# Patient Record
Sex: Male | Born: 2000 | Race: White | Hispanic: No | Marital: Single | Smoking: Current some day smoker
Health system: Southern US, Community
[De-identification: ages and names within clinical notes are randomized; demographics above are authoritative.]

## PROBLEM LIST (undated history)

## (undated) DIAGNOSIS — J45909 Unspecified asthma, uncomplicated: Secondary | ICD-10-CM

---

## 2018-03-11 ENCOUNTER — Other Ambulatory Visit: Payer: Self-pay | Admitting: Family Medicine

## 2018-03-11 ENCOUNTER — Ambulatory Visit
Admission: RE | Admit: 2018-03-11 | Discharge: 2018-03-11 | Disposition: A | Discharge status: Home / Self Care | Payer: BLUE CROSS/BLUE SHIELD | Source: Ambulatory Visit | Attending: Family Medicine | Admitting: Family Medicine

## 2018-03-11 DIAGNOSIS — R059 Cough, unspecified: Secondary | ICD-10-CM

## 2018-03-11 DIAGNOSIS — R05 Cough: Secondary | ICD-10-CM | POA: Insufficient documentation

## 2019-07-12 ENCOUNTER — Ambulatory Visit: Payer: BLUE CROSS/BLUE SHIELD | Attending: Internal Medicine

## 2019-07-12 DIAGNOSIS — Z23 Encounter for immunization: Secondary | ICD-10-CM

## 2019-07-12 NOTE — Progress Notes (Signed)
   Covid-19 Vaccination Clinic  Name:  Londen Bok    MRN: [id number] DOB: 2000/07/19  07/12/2019  Mr. Rideout was observed post Covid-19 immunization for 15 minutes without incident. He was provided with Vaccine Information Sheet and instruction to access the V-Safe system.   Mr. Quackenbush was instructed to call 911 with any severe reactions post vaccine: Marland Kitchen Difficulty breathing  . Swelling of face and throat  . A fast heartbeat  . A bad rash all over body  . Dizziness and weakness   Immunizations Administered    Name Date Dose VIS Date Route   Pfizer COVID-19 Vaccine 07/12/2019 12:30 PM 0.3 mL 04/02/2019 Intramuscular   Manufacturer: [employer], [employer]   Lot: [id number]   NDC: [address]-1

## 2019-08-02 ENCOUNTER — Ambulatory Visit: Payer: Self-pay | Attending: Internal Medicine

## 2019-08-02 DIAGNOSIS — Z23 Encounter for immunization: Secondary | ICD-10-CM

## 2019-08-02 NOTE — Progress Notes (Signed)
   Covid-19 Vaccination Clinic  Name:  Martin Boyer    MRN: [id number] DOB: Feb 13, 2001  08/02/2019  [name] was observed post Covid-19 immunization for 15 minutes without incident. He was provided with Vaccine Information Sheet and instruction to access the V-Safe system.   [name] was instructed to call 911 with any severe reactions post vaccine: Marland Kitchen Difficulty breathing  . Swelling of face and throat  . A fast heartbeat  . A bad rash all over body  . Dizziness and weakness   Immunizations Administered    Name Date Dose VIS Date Route   Pfizer COVID-19 Vaccine 08/02/2019 11:41 AM 0.3 mL 04/02/2019 Intramuscular   Manufacturer: [employer], [employer]   Lot: [id number]   NDC: [address]-2

## 2020-05-09 ENCOUNTER — Emergency Department
Admission: EM | Admit: 2020-05-09 | Discharge: 2020-05-09 | Disposition: A | Discharge status: Home / Self Care | Payer: BLUE CROSS/BLUE SHIELD | Attending: Emergency Medicine | Admitting: Emergency Medicine

## 2020-05-09 ENCOUNTER — Encounter: Payer: Self-pay | Admitting: Emergency Medicine

## 2020-05-09 ENCOUNTER — Emergency Department: Payer: BLUE CROSS/BLUE SHIELD

## 2020-05-09 ENCOUNTER — Other Ambulatory Visit: Payer: Self-pay

## 2020-05-09 DIAGNOSIS — R519 Headache, unspecified: Secondary | ICD-10-CM

## 2020-05-09 DIAGNOSIS — H53149 Visual discomfort, unspecified: Secondary | ICD-10-CM | POA: Insufficient documentation

## 2020-05-09 DIAGNOSIS — J45909 Unspecified asthma, uncomplicated: Secondary | ICD-10-CM | POA: Diagnosis not present

## 2020-05-09 HISTORY — DX: Unspecified asthma, uncomplicated: J45.909

## 2020-05-09 NOTE — ED Triage Notes (Signed)
Pt to ed with c/o intermittent headaches x 2 weeks. Pt states pressure in the back of head on L side associated with headaches. Pt states last night he was playing video games and felt right arm numbness that lasted about 30 seconds and then right cheek muscle spasm for about 30 seconds. Pt states he feels mildly disoriented at this time. Pt alert and oriented x 4. Appears in NAD.

## 2020-05-09 NOTE — ED Provider Notes (Signed)
[facility] Emergency Department Provider Note  ____________________________________________   Event Date/Time   First MD Initiated Contact with Patient 05/09/20 1658     (approximate)  I have reviewed the triage vital signs and the nursing notes.   HISTORY  Chief Complaint Headache   HPI Martin Boyer is a 20 y.o. male with a past medical history of asthma who presents for assessment approximately 2 weeks of headache described as a pressure in the left posterior aspect of his head.  States this is associated with some photophobia and some black spots in his vision as well as an episode yesterday that lasted maybe 30 seconds where he felt he could not move his right arm and had some twitching on the right side of his face.  He has no history of seizures.  No history of recent trauma or injuries.  He has not had any other similar twitching or numbness or weakness episodes.  No vertigo, earache, sore throat, chest pain, cough, shortness of breath, abdominal pain, back pain, urinary symptoms, rash or other extremity symptoms.  Denies regular NSAID use, illicit drug use, or tobacco abuse.  No family history of seizures.  No other acute concerns at this time.         Past Medical History:  Diagnosis Date  . Asthma     There are no problems to display for this patient.   History reviewed. No pertinent surgical history.  Prior to Admission medications   Not on File    Allergies Patient has no known allergies.  No family history on file.  Social History Social History   Tobacco Use  . Smoking status: Never Smoker  . Smokeless tobacco: Never Used  Vaping Use  . Vaping Use: Never used  Substance Use Topics  . Alcohol use: Not Currently  . Drug use: Yes    Types: Marijuana    Review of Systems  Review of Systems  Constitutional: Negative for chills and fever.  HENT: Negative for sore throat.   Eyes: Negative for pain.  Respiratory:  Negative for cough and stridor.   Cardiovascular: Negative for chest pain.  Gastrointestinal: Negative for vomiting.  Genitourinary: Negative for dysuria.  Musculoskeletal: Negative for myalgias.  Skin: Negative for rash.  Neurological: Positive for tremors ( R face yesterday ), weakness ( R arm) and headaches. Negative for seizures and loss of consciousness.  Psychiatric/Behavioral: Negative for suicidal ideas.  All other systems reviewed and are negative.     ____________________________________________   PHYSICAL EXAM:  VITAL SIGNS: ED Triage Vitals  Enc Vitals Group     BP 05/09/20 1518 135/81     Pulse Rate 05/09/20 1518 (!) 58     Resp 05/09/20 1518 16     Temp 05/09/20 1518 98.5 F (36.9 C)     Temp Source 05/09/20 1518 Oral     SpO2 05/09/20 1518 100 %     Weight 05/09/20 1518 150 lb (68 kg)     Height 05/09/20 1518 5\' 6"  (1.676 m)     Head Circumference --      Peak Flow --      Pain Score 05/09/20 1523 1     Pain Loc --      Pain Edu? --      Excl. in GC? --    Vitals:   05/09/20 1518  BP: 135/81  Pulse: (!) 58  Resp: 16  Temp: 98.5 F (36.9 C)  SpO2: 100%   Physical  Exam Vitals and nursing note reviewed.  Constitutional:      Appearance: He is well-developed and well-nourished.  HENT:     Head: Normocephalic and atraumatic.     Right Ear: External ear normal.     Left Ear: External ear normal.     Nose: Nose normal.     Mouth/Throat:     Mouth: Mucous membranes are moist.  Eyes:     Conjunctiva/sclera: Conjunctivae normal.  Cardiovascular:     Rate and Rhythm: Normal rate and regular rhythm.     Heart sounds: No murmur heard.   Pulmonary:     Effort: Pulmonary effort is normal. No respiratory distress.     Breath sounds: Normal breath sounds.  Abdominal:     Palpations: Abdomen is soft.     Tenderness: There is no abdominal tenderness.  Musculoskeletal:        General: No edema.     Cervical back: Neck supple.  Skin:    General: Skin  is warm and dry.     Capillary Refill: Capillary refill takes less than 2 seconds.  Neurological:     Mental Status: He is alert and oriented to person, place, and time.  Psychiatric:        Mood and Affect: Mood and affect and mood normal.      ____________________________________________   ____________________________________________  RADIOLOGY  ED MD interpretation: No evidence of intracranial abnormality.  Official radiology report(s): CT Head Wo Contrast  Result Date: 05/09/2020 CLINICAL DATA:  Headache, intracranial hemorrhage suspected. Additional history provided: Patient reports pressure in back of head on left side associated with headaches, right arm numbness last night, right cheek muscle spasm for 30 seconds at that time, patient reports feeling mildly disoriented. EXAM: CT HEAD WITHOUT CONTRAST TECHNIQUE: Contiguous axial images were obtained from the base of the skull through the vertex without intravenous contrast. COMPARISON:  No pertinent prior exams available for comparison. FINDINGS: Brain: Cerebral volume is normal. There is no acute intracranial hemorrhage. No demarcated cortical infarct. No extra-axial fluid collection. No evidence of intracranial mass. No midline shift. Vascular: No hyperdense vessel. Skull: Normal. Negative for fracture or focal lesion. Sinuses/Orbits: Visualized orbits show no acute finding. No significant paranasal sinus disease at the imaged levels. IMPRESSION: Unremarkable non-contrast CT appearance of the brain. No evidence of acute intracranial abnormality. Electronically Signed   By: Jackey Loge DO   On: 05/09/2020 17:58    ____________________________________________   PROCEDURES  Procedure(s) performed (including Critical Care):  Procedures   ____________________________________________   INITIAL IMPRESSION / ASSESSMENT AND PLAN / ED COURSE      Patient presents to the left history and exam for assessment of 2 weeks of  headache associate with some photophobia and some small black spots that are shifting in vision as well as episode yesterday of some right arm weakness and twitching in the right face that lasted less than 30 seconds.  Patient did not seek care at this time but is presenting today for evaluation of the above-noted symptoms.  On arrival he is afebrile and hemodynamically stable.  Primary differential includes but is not limited to migraine versus other primary headache disorder with possible complex features, focal seizure, fibromyalgia versus neuropathic intracranial hypertension versus intracranial hypertension secondary to possible mass-effect.  Patient is completely neurovascularly intact on assessment and there are no findings to suggest CVA and there is no seizure activity evident on exam.  No historical or exam factors to suggest acute traumatic injury or  acute infectious or metabolic derangement.  History and exam is not clearly consistent with cervicalgia.  No mass-effect or other acute abnormality seen on CT.  Given possible seizure versus atypical migraine I advised patient to follow-up with neurology.  Patient declined any analgesia or antiemetics in the emergency room.  I did advise patient to refrain from any unnecessary screen time or video games she was noted to play games on his phone throughout his time in emergency room.  It certainly possible this is contributing to his headache.  Will refer to neurology for further evaluation and management.  Given stable vitals with otherwise reassuring exam and normal head CT I believe patient is safe for discharge with further evaluation from neurology.  Discharged stable condition.  Strict return precautions advised and discussed.      ____________________________________________   FINAL CLINICAL IMPRESSION(S) / ED DIAGNOSES  Final diagnoses:  Nonintractable headache, unspecified chronicity pattern, unspecified headache type    Medications  - No data to display   ED Discharge Orders    None       Note:  This document was prepared using Dragon voice recognition software and may include unintentional dictation errors.   Gilles Chiquito, MD 05/09/20 [phone]

## 2020-05-09 NOTE — ED Notes (Signed)
ED Provider at bedside. 

## 2020-05-17 ENCOUNTER — Other Ambulatory Visit: Payer: Self-pay | Admitting: Neurology

## 2020-05-17 DIAGNOSIS — G43119 Migraine with aura, intractable, without status migrainosus: Secondary | ICD-10-CM

## 2020-05-27 ENCOUNTER — Other Ambulatory Visit: Payer: Self-pay

## 2020-05-27 ENCOUNTER — Ambulatory Visit
Admission: RE | Admit: 2020-05-27 | Discharge: 2020-05-27 | Disposition: A | Discharge status: Home / Self Care | Payer: BLUE CROSS/BLUE SHIELD | Source: Ambulatory Visit | Attending: Neurology | Admitting: Neurology

## 2020-05-27 DIAGNOSIS — G43119 Migraine with aura, intractable, without status migrainosus: Secondary | ICD-10-CM | POA: Insufficient documentation

## 2020-05-27 MED ORDER — GADOBUTROL 1 MMOL/ML IV SOLN
7.0000 mL | Freq: Once | INTRAVENOUS | Status: AC | PRN
  Administered 2020-05-27: 7 mL via INTRAVENOUS

## 2020-08-14 ENCOUNTER — Other Ambulatory Visit: Payer: Self-pay

## 2020-08-14 ENCOUNTER — Ambulatory Visit
Admission: EM | Admit: 2020-08-14 | Discharge: 2020-08-14 | Disposition: A | Discharge status: Home / Self Care | Payer: BLUE CROSS/BLUE SHIELD | Attending: Emergency Medicine | Admitting: Emergency Medicine

## 2020-08-14 DIAGNOSIS — J4521 Mild intermittent asthma with (acute) exacerbation: Secondary | ICD-10-CM

## 2020-08-14 DIAGNOSIS — Z20822 Contact with and (suspected) exposure to covid-19: Secondary | ICD-10-CM

## 2020-08-14 MED ORDER — PREDNISONE 10 MG (21) PO TBPK: ORAL_TABLET | Freq: Every day | ORAL | 0 refills | Status: AC

## 2020-08-14 NOTE — Discharge Instructions (Signed)
Take the prednisone as directed.  Continue to use your albuterol inhaler as directed.    Go to the emergency department if you have acute shortness of breath or other concerning symptoms.    Follow up with your primary care provider if your symptoms are not improving.

## 2020-08-14 NOTE — ED Provider Notes (Signed)
Renaldo Fiddler    CSN: [id number] Arrival date & time: 08/14/20  1120      History   Chief Complaint Chief Complaint  Patient presents with  . Chest Congestion    HPI Martin Boyer is a 20 y.o. male.   Patient presents with 2-week history of nonproductive cough, wheezing, chest congestion.  He states his symptoms are worse at night.  He reports history of asthma has been using an albuterol inhaler for his symptoms.  He denies fever, chills, sore throat, shortness of breath, vomiting, diarrhea, or other symptoms.  Negative COVID test at home.  No other pertinent medical history.     The history is provided by the patient.    Past Medical History:  Diagnosis Date  . Asthma     There are no problems to display for this patient.   History reviewed. No pertinent surgical history.     Home Medications    Prior to Admission medications   Medication Sig Start Date End Date Taking? Authorizing Provider  predniSONE (STERAPRED UNI-PAK 21 TAB) 10 MG (21) TBPK tablet Take by mouth daily. As directed 08/14/20  Yes Mickie Bail, NP    Family History No family history on file.  Social History Social History   Tobacco Use  . Smoking status: Never Smoker  . Smokeless tobacco: Never Used  Vaping Use  . Vaping Use: Never used  Substance Use Topics  . Alcohol use: Not Currently  . Drug use: Yes    Types: Marijuana     Allergies   Patient has no known allergies.   Review of Systems Review of Systems  Constitutional: Negative for chills and fever.  HENT: Positive for congestion. Negative for ear pain and sore throat.   Eyes: Negative for pain and visual disturbance.  Respiratory: Positive for cough and wheezing. Negative for shortness of breath.   Cardiovascular: Negative for chest pain and palpitations.  Gastrointestinal: Negative for abdominal pain, diarrhea and vomiting.  Genitourinary: Negative for dysuria and hematuria.  Musculoskeletal: Negative for  arthralgias and back pain.  Skin: Negative for color change and rash.  Neurological: Negative for seizures and syncope.  All other systems reviewed and are negative.    Physical Exam Triage Vital Signs ED Triage Vitals  Enc Vitals Group     BP      Pulse      Resp      Temp      Temp src      SpO2      Weight      Height      Head Circumference      Peak Flow      Pain Score      Pain Loc      Pain Edu?      Excl. in GC?    No data found.  Updated Vital Signs BP 122/78   Pulse 74   Temp 98.8 F (37.1 C) (Oral)   Resp 16   Ht 5\' 6"  (1.676 m)   Wt 150 lb (68 kg)   SpO2 98%   BMI 24.21 kg/m   Visual Acuity Right Eye Distance:   Left Eye Distance:   Bilateral Distance:    Right Eye Near:   Left Eye Near:    Bilateral Near:     Physical Exam Vitals and nursing note reviewed.  Constitutional:      General: He is not in acute distress.    Appearance: He is well-developed.  He is not ill-appearing.  HENT:     Head: Normocephalic and atraumatic.     Right Ear: Tympanic membrane normal.     Left Ear: Tympanic membrane normal.     Nose: Nose normal.     Mouth/Throat:     Mouth: Mucous membranes are moist.     Pharynx: Oropharynx is clear.  Eyes:     Conjunctiva/sclera: Conjunctivae normal.  Cardiovascular:     Rate and Rhythm: Normal rate and regular rhythm.     Heart sounds: Normal heart sounds.  Pulmonary:     Effort: Pulmonary effort is normal. No respiratory distress.     Breath sounds: Normal breath sounds. No wheezing.     Comments: Good air movement. Abdominal:     Palpations: Abdomen is soft.     Tenderness: There is no abdominal tenderness.  Musculoskeletal:     Cervical back: Neck supple.  Skin:    General: Skin is warm and dry.  Neurological:     General: No focal deficit present.     Mental Status: He is alert and oriented to person, place, and time.     Gait: Gait normal.  Psychiatric:        Mood and Affect: Mood normal.         Behavior: Behavior normal.      UC Treatments / Results  Labs (all labs ordered are listed, but only abnormal results are displayed) Labs Reviewed  NOVEL CORONAVIRUS, NAA    EKG   Radiology No results found.  Procedures Procedures (including critical care time)  Medications Ordered in UC Medications - No data to display  Initial Impression / Assessment and Plan / UC Course  I have reviewed the triage vital signs and the nursing notes.  Pertinent labs & imaging results that were available during my care of the patient were reviewed by me and considered in my medical decision making (see chart for details).   Mild intermittent asthma with acute exacerbation.  Screening for COVID.   No wheezing on exam; good air movement; O2 sat 98%.  Patient states the wheezing and cough are worse at night.  Treating with prednisone and continued use of albuterol inhaler.  ED precautions discussed and instructed patient to follow-up with his PCP if his symptoms are not improving.  PCR COVID pending.  Instructed patient to self quarantine until the test result is back.  He agrees to plan of care.   Final Clinical Impressions(s) / UC Diagnoses   Final diagnoses:  Encounter for screening laboratory testing for COVID-19 virus  Mild intermittent asthma with acute exacerbation     Discharge Instructions     Take the prednisone as directed.  Continue to use your albuterol inhaler as directed.    Go to the emergency department if you have acute shortness of breath or other concerning symptoms.    Follow up with your primary care provider if your symptoms are not improving.        ED Prescriptions    Medication Sig Dispense Auth. Provider   predniSONE (STERAPRED UNI-PAK 21 TAB) 10 MG (21) TBPK tablet Take by mouth daily. As directed 21 tablet Mickie Bail, NP     PDMP not reviewed this encounter.   Mickie Bail, NP 08/14/20 1227

## 2020-08-14 NOTE — ED Triage Notes (Signed)
Pt reports having chest congestion and dry cough x2 weeks. No other symptoms or concerns at this time.

## 2020-08-15 LAB — SARS-COV-2, NAA 2 DAY TAT

## 2020-08-15 LAB — NOVEL CORONAVIRUS, NAA: SARS-CoV-2, NAA: NOT DETECTED

## 2021-02-03 ENCOUNTER — Emergency Department
Admission: EM | Admit: 2021-02-03 | Discharge: 2021-02-03 | Disposition: A | Discharge status: Home / Self Care | Payer: BLUE CROSS/BLUE SHIELD | Attending: Student in an Organized Health Care Education/Training Program | Admitting: Student in an Organized Health Care Education/Training Program

## 2021-02-03 ENCOUNTER — Encounter: Payer: Self-pay | Admitting: Emergency Medicine

## 2021-02-03 ENCOUNTER — Emergency Department: Payer: BLUE CROSS/BLUE SHIELD

## 2021-02-03 ENCOUNTER — Other Ambulatory Visit: Payer: Self-pay

## 2021-02-03 DIAGNOSIS — Z20822 Contact with and (suspected) exposure to covid-19: Secondary | ICD-10-CM | POA: Insufficient documentation

## 2021-02-03 DIAGNOSIS — M5126 Other intervertebral disc displacement, lumbar region: Secondary | ICD-10-CM | POA: Insufficient documentation

## 2021-02-03 DIAGNOSIS — J45909 Unspecified asthma, uncomplicated: Secondary | ICD-10-CM | POA: Insufficient documentation

## 2021-02-03 DIAGNOSIS — M5136 Other intervertebral disc degeneration, lumbar region: Secondary | ICD-10-CM

## 2021-02-03 DIAGNOSIS — A084 Viral intestinal infection, unspecified: Secondary | ICD-10-CM | POA: Diagnosis not present

## 2021-02-03 DIAGNOSIS — M5134 Other intervertebral disc degeneration, thoracic region: Secondary | ICD-10-CM

## 2021-02-03 DIAGNOSIS — M5124 Other intervertebral disc displacement, thoracic region: Secondary | ICD-10-CM | POA: Diagnosis not present

## 2021-02-03 DIAGNOSIS — R1110 Vomiting, unspecified: Secondary | ICD-10-CM | POA: Diagnosis present

## 2021-02-03 LAB — URINALYSIS, COMPLETE (UACMP) WITH MICROSCOPIC
Bacteria, UA: NONE SEEN
Bilirubin Urine: NEGATIVE
Glucose, UA: NEGATIVE mg/dL
Hgb urine dipstick: NEGATIVE
Ketones, ur: 20 mg/dL — AB
Leukocytes,Ua: NEGATIVE
Nitrite: NEGATIVE
Protein, ur: 30 mg/dL — AB
Specific Gravity, Urine: 1.031 — ABNORMAL HIGH (ref 1.005–1.030)
Squamous Epithelial / HPF: NONE SEEN (ref 0–5)
pH: 7 (ref 5.0–8.0)

## 2021-02-03 LAB — CBC WITH DIFFERENTIAL/PLATELET
Abs Immature Granulocytes: 0.07 10*3/uL (ref 0.00–0.07)
Basophils Absolute: 0.1 10*3/uL (ref 0.0–0.1)
Basophils Relative: 0 %
Eosinophils Absolute: 0.1 10*3/uL (ref 0.0–0.5)
Eosinophils Relative: 0 %
HCT: 47.2 % (ref 39.0–52.0)
Hemoglobin: 17.2 g/dL — ABNORMAL HIGH (ref 13.0–17.0)
Immature Granulocytes: 0 %
Lymphocytes Relative: 9 %
Lymphs Abs: 1.6 10*3/uL (ref 0.7–4.0)
MCH: 32.5 pg (ref 26.0–34.0)
MCHC: 36.4 g/dL — ABNORMAL HIGH (ref 30.0–36.0)
MCV: 89.2 fL (ref 80.0–100.0)
Monocytes Absolute: 1.8 10*3/uL — ABNORMAL HIGH (ref 0.1–1.0)
Monocytes Relative: 10 %
Neutro Abs: 14.7 10*3/uL — ABNORMAL HIGH (ref 1.7–7.7)
Neutrophils Relative %: 81 %
Platelets: 242 10*3/uL (ref 150–400)
RBC: 5.29 MIL/uL (ref 4.22–5.81)
RDW: 11.2 % — ABNORMAL LOW (ref 11.5–15.5)
WBC: 18.3 10*3/uL — ABNORMAL HIGH (ref 4.0–10.5)
nRBC: 0 % (ref 0.0–0.2)

## 2021-02-03 LAB — COMPREHENSIVE METABOLIC PANEL
ALT: 18 U/L (ref 0–44)
AST: 21 U/L (ref 15–41)
Albumin: 4.3 g/dL (ref 3.5–5.0)
Alkaline Phosphatase: 49 U/L (ref 38–126)
Anion gap: 9 (ref 5–15)
BUN: 21 mg/dL — ABNORMAL HIGH (ref 6–20)
CO2: 29 mmol/L (ref 22–32)
Calcium: 9.4 mg/dL (ref 8.9–10.3)
Chloride: 97 mmol/L — ABNORMAL LOW (ref 98–111)
Creatinine, Ser: 1.06 mg/dL (ref 0.61–1.24)
GFR, Estimated: >60 mL/min (ref >60)
Glucose, Bld: 98 mg/dL (ref 70–99)
Potassium: 3.6 mmol/L (ref 3.5–5.1)
Sodium: 135 mmol/L (ref 135–145)
Total Bilirubin: 0.9 mg/dL (ref 0.3–1.2)
Total Protein: 7.1 g/dL (ref 6.5–8.1)

## 2021-02-03 LAB — RESP PANEL BY RT-PCR (FLU A&B, COVID) ARPGX2
Influenza A by PCR: NEGATIVE
Influenza B by PCR: NEGATIVE
SARS Coronavirus 2 by RT PCR: NEGATIVE

## 2021-02-03 LAB — LIPASE, BLOOD: Lipase: 38 U/L (ref 11–51)

## 2021-02-03 LAB — LACTIC ACID, PLASMA: Lactic Acid, Venous: 1.1 mmol/L (ref 0.5–1.9)

## 2021-02-03 MED ORDER — OXYCODONE-ACETAMINOPHEN 5-325 MG PO TABS: 1.0000 | ORAL_TABLET | Freq: Four times a day (QID) | ORAL | 0 refills | Status: AC | PRN

## 2021-02-03 MED ORDER — MORPHINE SULFATE (PF) 4 MG/ML IV SOLN
4.0000 mg | Freq: Once | INTRAVENOUS | Status: AC
  Administered 2021-02-03: 4 mg via INTRAVENOUS
  Filled 2021-02-03: qty 1

## 2021-02-03 MED ORDER — KETOROLAC TROMETHAMINE 30 MG/ML IJ SOLN
30.0000 mg | Freq: Once | INTRAMUSCULAR | Status: AC
  Administered 2021-02-03: 30 mg via INTRAVENOUS
  Filled 2021-02-03: qty 1

## 2021-02-03 MED ORDER — ONDANSETRON HCL 4 MG/2ML IJ SOLN
4.0000 mg | Freq: Once | INTRAMUSCULAR | Status: AC
  Administered 2021-02-03: 4 mg via INTRAVENOUS
  Filled 2021-02-03: qty 2

## 2021-02-03 MED ORDER — DEXAMETHASONE SODIUM PHOSPHATE 10 MG/ML IJ SOLN
10.0000 mg | Freq: Once | INTRAMUSCULAR | Status: AC
  Administered 2021-02-03: 10 mg via INTRAVENOUS
  Filled 2021-02-03: qty 1

## 2021-02-03 MED ORDER — ONDANSETRON 4 MG PO TBDP: 4.0000 mg | ORAL_TABLET | Freq: Three times a day (TID) | ORAL | 0 refills | Status: AC | PRN

## 2021-02-03 MED ORDER — SODIUM CHLORIDE 0.9 % IV BOLUS
1000.0000 mL | Freq: Once | INTRAVENOUS | Status: AC
  Administered 2021-02-03: 1000 mL via INTRAVENOUS

## 2021-02-03 MED ORDER — MELOXICAM 15 MG PO TABS: 15.0000 mg | ORAL_TABLET | Freq: Every day | ORAL | 0 refills | Status: AC

## 2021-02-03 MED ORDER — METHOCARBAMOL 500 MG PO TABS: 500.0000 mg | ORAL_TABLET | Freq: Four times a day (QID) | ORAL | 0 refills | Status: AC

## 2021-02-03 MED ORDER — ORPHENADRINE CITRATE 30 MG/ML IJ SOLN
60.0000 mg | Freq: Once | INTRAMUSCULAR | Status: AC
  Administered 2021-02-03: 60 mg via INTRAVENOUS
  Filled 2021-02-03: qty 2

## 2021-02-03 NOTE — ED Triage Notes (Signed)
Pt comes ems from home with back pain for 7-8 months. Has a back brace and had to take it off the past couple of days due to food poisoning. Hx of bulging disc/fissure.

## 2021-02-03 NOTE — ED Notes (Addendum)
Pt reports inc in back pain again. Pt requesting pain meds. Provider JC notified via secure chat. Urine sample sent to lab; amber in color.

## 2021-02-03 NOTE — ED Notes (Signed)
See triage note, pt reports back pain for several months that worsened over the past few days after having stomach bug and having to take back brace off d/t vomiting. Pt with back brace on at this time. Pt refuses to walk or transfer. To treatment room in recliner.  Hx of bulging disc. States has declined pain meds in past

## 2021-02-03 NOTE — ED Notes (Signed)
Pt back from MRI. Requesting crackers; told provider will likely want to see MRI results before allowing him to eat but that this RN happy to ask.

## 2021-02-03 NOTE — ED Provider Notes (Signed)
[facility] Emergency Department Provider Note  ____________________________________________  Time seen: Approximately 11:47 AM  I have reviewed the triage vital signs and the nursing notes.   HISTORY  Chief Complaint No chief complaint on file.    HPI Martin Boyer is a 20 y.o. male who presents to the emergency department for 2 complaints.  Patient states that over the last 3 to 4 days he has had significant vomiting.  He believes that he had food poisoning as he has not had any other associated symptoms to include fevers, URI symptoms, cough, shortness of breath, diarrhea.  Patient has had no abdominal pain.    Patient does have a history of bulging disc at C5, L5 and herniated disc at T5.  He states that he typically wears a brace and is being seen by physiatry for ongoing back issues.  Patient states that he has not been able to wear his brace given the amount of vomiting that he has had and he is now experiencing significant mid and lower back pain.  Patient has mild pain all the time but is now having severe pain to the point that he is unable to ambulate.  Patient still is able to move his lower extremities but does have numbness and tingling which is new.  Patient denies any bowel or bladder dysfunction, saddle anesthesia, paresthesias.  Patient does not take any controlled substances for his back pain.  No direct trauma to his mid or lower back.  No urinary changes       Past Medical History:  Diagnosis Date   Asthma     There are no problems to display for this patient.   History reviewed. No pertinent surgical history.  Prior to Admission medications   Medication Sig Start Date End Date Taking? Authorizing Provider  meloxicam (MOBIC) 15 MG tablet Take 1 tablet (15 mg total) by mouth daily. 02/03/21  Yes Patrich Heinze, Delorise Royals, PA-C  methocarbamol (ROBAXIN) 500 MG tablet Take 1 tablet (500 mg total) by mouth 4 (four) times daily. 02/03/21  Yes  Javious Hallisey, Delorise Royals, PA-C  ondansetron (ZOFRAN-ODT) 4 MG disintegrating tablet Take 1 tablet (4 mg total) by mouth every 8 (eight) hours as needed for nausea or vomiting. 02/03/21  Yes Trevian Hayashida, Delorise Royals, PA-C  oxyCODONE-acetaminophen (PERCOCET/ROXICET) 5-325 MG tablet Take 1 tablet by mouth every 6 (six) hours as needed for severe pain. 02/03/21  Yes Gurkirat Basher, Delorise Royals, PA-C  predniSONE (STERAPRED UNI-PAK 21 TAB) 10 MG (21) TBPK tablet Take by mouth daily. As directed 08/14/20   Mickie Bail, NP    Allergies Patient has no known allergies.  No family history on file.  Social History Social History   Tobacco Use   Smoking status: Never   Smokeless tobacco: Never  Vaping Use   Vaping Use: Never used  Substance Use Topics   Alcohol use: Not Currently   Drug use: Yes    Types: Marijuana     Review of Systems  Constitutional: No fever/chills Eyes: No visual changes. No discharge ENT: No upper respiratory complaints. Cardiovascular: no chest pain. Respiratory: no cough. No SOB. Gastrointestinal: No abdominal pain.  Positive for nausea and vomiting.  No diarrhea.  No constipation. Genitourinary: Negative for dysuria. No hematuria Musculoskeletal: Positive for severe mid and lower back pain Skin: Negative for rash, abrasions, lacerations, ecchymosis. Neurological: Negative for headaches, focal weakness or numbness.  10 System ROS otherwise negative.  ____________________________________________   PHYSICAL EXAM:  VITAL SIGNS: ED Triage Vitals  Enc Vitals Group     BP 02/03/21 1112 103/67     Pulse Rate 02/03/21 1112 81     Resp 02/03/21 1112 18     Temp 02/03/21 1112 99 F (37.2 C)     Temp Source 02/03/21 1112 Oral     SpO2 02/03/21 1112 100 %     Weight 02/03/21 1110 125 lb (56.7 kg)     Height 02/03/21 1110 5\' 6"  (1.676 m)     Head Circumference --      Peak Flow --      Pain Score 02/03/21 1110 7     Pain Loc --      Pain Edu? --      Excl. in GC? --       Constitutional: Alert and oriented. Well appearing and in no acute distress. Eyes: Conjunctivae are normal. PERRL. EOMI. Head: Atraumatic. ENT:      Ears:       Nose: No congestion/rhinnorhea.      Mouth/Throat: Mucous membranes are moist.  Neck: No stridor.  No cervical spine tenderness to palpation.  Cardiovascular: Normal rate, regular rhythm. Normal S1 and S2.  Good peripheral circulation. Respiratory: Normal respiratory effort without tachypnea or retractions. Lungs CTAB. Good air entry to the bases with no decreased or absent breath sounds. Gastrointestinal: Bowel sounds 4 quadrants. Soft and nontender to palpation. No guarding or rigidity. No palpable masses. No distention. No CVA tenderness Musculoskeletal: Full range of motion to all extremities. No gross deformities appreciated.  Palpation along the thoracic and lumbar spine revealed diffuse midline and bilateral paraspinal muscle tenderness.  Patient was more tender on the right paraspinal muscle groups when compared with left.  There is no step-off or palpable abnormality.  No tenderness into the sciatic notches or SI joints.  Patient with dorsalis pedis pulse intact bilateral lower extremities.  Sensation intact and equal bilateral lower extremities with no deficits. Neurologic:  Normal speech and language. No gross focal neurologic deficits are appreciated.  Skin:  Skin is warm, dry and intact. No rash noted. Psychiatric: Mood and affect are normal. Speech and behavior are normal. Patient exhibits appropriate insight and judgement.   ____________________________________________   LABS (all labs ordered are listed, but only abnormal results are displayed)  Labs Reviewed  COMPREHENSIVE METABOLIC PANEL - Abnormal; Notable for the following components:      Result Value   Chloride 97 (*)    BUN 21 (*)    All other components within normal limits  CBC WITH DIFFERENTIAL/PLATELET - Abnormal; Notable for the following  components:   WBC 18.3 (*)    Hemoglobin 17.2 (*)    MCHC 36.4 (*)    RDW 11.2 (*)    Neutro Abs 14.7 (*)    Monocytes Absolute 1.8 (*)    All other components within normal limits  URINALYSIS, COMPLETE (UACMP) WITH MICROSCOPIC - Abnormal; Notable for the following components:   Color, Urine YELLOW (*)    APPearance CLEAR (*)    Specific Gravity, Urine 1.031 (*)    Ketones, ur 20 (*)    Protein, ur 30 (*)    All other components within normal limits  RESP PANEL BY RT-PCR (FLU A&B, COVID) ARPGX2  LIPASE, BLOOD  LACTIC ACID, PLASMA   ____________________________________________  EKG   ____________________________________________  RADIOLOGY I personally viewed and evaluated these images as part of my medical decision making, as well as reviewing the written report by the radiologist.  ED Provider Interpretation: Imaging  revealed bulging disks in the thoracic and lumbar spine but no central cord compression.  DG Thoracic Spine 2 View  Result Date: 02/03/2021 CLINICAL DATA:  Chronic back pain has been worsening. EXAM: THORACIC SPINE 2 VIEWS COMPARISON:  None. FINDINGS: There is no evidence of thoracic spine fracture. Alignment is normal. No other significant bone abnormalities are identified. IMPRESSION: Negative. Electronically Signed   By: Emmaline Kluver M.D.   On: 02/03/2021 13:09   DG Lumbar Spine 2-3 Views  Result Date: 02/03/2021 CLINICAL DATA:  Chronic back pain that has been worsening. History of bulging disc at L5. EXAM: LUMBAR SPINE - 2-3 VIEW COMPARISON:  None. FINDINGS: There is no evidence of lumbar spine fracture. Alignment is normal. Mild intervertebral disc space loss at L5-S1. Otherwise intact. There are L5 pars defects. Nonobstructive bowel gas pattern. SI joints are open and symmetric. IMPRESSION: 1. No acute osseous abnormality in the lumbar spine. 2. 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 Mild degenerative disc disease at L5-S1. Electronically Signed   By: Emmaline Kluver M.D.   On: 02/03/2021 13:12   DG Abdomen 1 View  Result Date: 02/03/2021 CLINICAL DATA:  Chronic back pain that has been worsening over the past several months. EXAM: ABDOMEN - 1 VIEW COMPARISON:  None. FINDINGS: Please note the diaphragms and far upper abdomen are excluded from field of view. The bowel gas pattern is normal. No radio-opaque calculi or other significant radiographic abnormality are seen. IMPRESSION: Negative. Electronically Signed   By: Emmaline Kluver M.D.   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 Worsening back pain, now numbness and tingling down both legs; Low back pain, progressive neurologic deficit L5 bulging disc. Worsening pain, now numbness and tingling down both legs EXAM: MRI THORACIC AND LUMBAR SPINE WITHOUT CONTRAST TECHNIQUE: Multiplanar and multiecho pulse sequences of the thoracic and lumbar spine were obtained without intravenous contrast. COMPARISON:  X-ray 02/03/2021 FINDINGS: MRI THORACIC SPINE FINDINGS Alignment:  Physiologic. Vertebrae: No fracture, evidence of discitis, or bone lesion. Cord:  Normal signal and morphology. Paraspinal and other soft tissues: Negative. Disc levels: Small focal central disc protrusion at T7-8 which abuts the ventral cord without resultant canal stenosis. Remaining intervertebral discs of the thoracic spine are otherwise preserved without disc desiccation or focal disc protrusion. Normal facet joints. No foraminal or canal stenosis at any level. MRI LUMBAR SPINE FINDINGS Segmentation:  Standard. Alignment:  Physiologic. Vertebrae:  No fracture, evidence of discitis, or bone lesion. Conus medullaris and cauda equina: Conus extends to the L1-2 level. Conus and cauda equina appear normal. Paraspinal and other soft tissues: Negative. Disc levels: T12-L1: Normal. L1-L2: Normal.  L2-L3: Normal. L3-L4: Normal. L4-L5: Normal. L5-S1: Mild right paracentral disc bulge. Unremarkable facet joints. There is mild right foraminal stenosis. No canal or subarticular recess stenosis. IMPRESSION: MR THORACIC SPINE IMPRESSION: 1. Small focal central disc protrusion at T7-8 which abuts the ventral cord without resultant canal stenosis. 2. Otherwise unremarkable MRI of the thoracic spine. MRI LUMBAR SPINE IMPRESSION: 1. Mild right paracentral disc bulge at L5-S1 which results in mild right foraminal stenosis. 2. Otherwise unremarkable MRI of the lumbar spine. Electronically Signed   By: Duanne Guess D.O.   On: 02/03/2021 14:48   MR LUMBAR SPINE WO CONTRAST  Result Date: 02/03/2021 CLINICAL DATA:  Motor neuron disease Mid-back pain Intervertebral disc disorder History of herniated disc at  T5. Worsening back pain, now numbness and tingling down both legs; Low back pain, progressive neurologic deficit L5 bulging disc. Worsening pain, now numbness and tingling down both legs EXAM: MRI THORACIC AND LUMBAR SPINE WITHOUT CONTRAST TECHNIQUE: Multiplanar and multiecho pulse sequences of the thoracic and lumbar spine were obtained without intravenous contrast. COMPARISON:  X-ray 02/03/2021 FINDINGS: MRI THORACIC SPINE FINDINGS Alignment:  Physiologic. Vertebrae: No fracture, evidence of discitis, or bone lesion. Cord:  Normal signal and morphology. Paraspinal and other soft tissues: Negative. Disc levels: Small focal central disc protrusion at T7-8 which abuts the ventral cord without resultant canal stenosis. Remaining intervertebral discs of the thoracic spine are otherwise preserved without disc desiccation or focal disc protrusion. Normal facet joints. No foraminal or canal stenosis at any level. MRI LUMBAR SPINE FINDINGS Segmentation:  Standard. Alignment:  Physiologic. Vertebrae:  No fracture, evidence of discitis, or bone lesion. Conus medullaris and cauda equina: Conus extends to the L1-2 level.  Conus and cauda equina appear normal. Paraspinal and other soft tissues: Negative. Disc levels: T12-L1: Normal. L1-L2: Normal. L2-L3: Normal. L3-L4: Normal. L4-L5: Normal. L5-S1: Mild right paracentral disc bulge. Unremarkable facet joints. There is mild right foraminal stenosis. No canal or subarticular recess stenosis. IMPRESSION: MR THORACIC SPINE IMPRESSION: 1. Small focal central disc protrusion at T7-8 which abuts the ventral cord without resultant canal stenosis. 2. Otherwise unremarkable MRI of the thoracic spine. MRI LUMBAR SPINE IMPRESSION: 1. Mild right paracentral disc bulge at L5-S1 which results in mild right foraminal stenosis. 2. Otherwise unremarkable MRI of the lumbar spine. Electronically Signed   By: Duanne Guess D.O.   On: 02/03/2021 14:48    ____________________________________________    PROCEDURES  Procedure(s) performed:    Procedures    Medications  sodium chloride 0.9 % bolus 1,000 mL (0 mLs Intravenous Stopped 02/03/21 1310)  morphine 4 MG/ML injection 4 mg (4 mg Intravenous Given 02/03/21 1218)  ondansetron (ZOFRAN) injection 4 mg (4 mg Intravenous Given 02/03/21 1217)  ketorolac (TORADOL) 30 MG/ML injection 30 mg (30 mg Intravenous Given 02/03/21 1657)  dexamethasone (DECADRON) injection 10 mg (10 mg Intravenous Given 02/03/21 1657)  orphenadrine (NORFLEX) injection 60 mg (60 mg Intravenous Given 02/03/21 1657)  morphine 4 MG/ML injection 4 mg (4 mg Intravenous Given 02/03/21 1657)     ____________________________________________   INITIAL IMPRESSION / ASSESSMENT AND PLAN / ED COURSE  Pertinent labs & imaging results that were available during my care of the patient were reviewed by me and considered in my medical decision making (see chart for details).  Review of the [place] CSRS was performed in accordance of the [facility] prior to dispensing any controlled drugs.           Patient's diagnosis is consistent with viral gastroenteritis, bulging  thoracic and lumbar disc.  Patient presented to the emergency department after having 3 to 4 days of nausea and vomiting.  Patient states that this had resolved yesterday, but during all the vomiting he had not been able to wear his back brace and had a increase in his back pain.  Given the severe pain, patient had imaging to include MRI of the thoracic and lumbar spine.  Labs are overall reassuring.  Slight bump in the white blood cell count consistent likely with viral gastroenteritis.  Patient had no tenderness on exam and again had resolved his GI complaints yesterday.  No indication for further abdominal imaging passed the 1 view x-ray.  Symptoms, results are discussed with the patient.  Patient will  have Zofran for any return of nausea.  If patient develops fever, abdominal pain or return of nausea, vomiting or diarrhea he can return for further evaluation.  Imaging of the thoracic and lumbar spine revealed bulging disc but no compression on the central cord.  Patient will be treated symptomatically with medications for his back pain.  Follow-up primary care neurosurgery as needed.   Patient is given ED precautions to return to the ED for any worsening or new symptoms.     ____________________________________________  FINAL CLINICAL IMPRESSION(S) / ED DIAGNOSES  Final diagnoses:  Viral gastroenteritis  Bulging of thoracic intervertebral disc  Bulging lumbar disc      NEW MEDICATIONS STARTED DURING THIS VISIT:  ED Discharge Orders          Ordered    meloxicam (MOBIC) 15 MG tablet  Daily        02/03/21 1706    methocarbamol (ROBAXIN) 500 MG tablet  4 times daily        02/03/21 1706    oxyCODONE-acetaminophen (PERCOCET/ROXICET) 5-325 MG tablet  Every 6 hours PRN        02/03/21 1706    ondansetron (ZOFRAN-ODT) 4 MG disintegrating tablet  Every 8 hours PRN        02/03/21 1706                This chart was dictated using voice recognition software/Dragon. Despite best  efforts to proofread, errors can occur which can change the meaning. Any change was purely unintentional.    Racheal Patches, PA-C 02/03/21 1734    Willy Eddy, MD 02/03/21 [phone]

## 2021-02-03 NOTE — ED Notes (Signed)
Notified provider JC via secure chat that pt continues to request pain meds.

## 2021-02-03 NOTE — ED Notes (Signed)
Pt notified this RN and provider have been assisting in an emergency but will provide pain meds soon. Pt laying calmly on stretcher; resp reg/unlabored; skin dry.

## 2021-02-03 NOTE — ED Notes (Signed)
Pt reminded of need for urine sample. Given urinal again as imaging staff had put urinal on table across room. Pt on personal phone with family/friend.

## 2021-02-03 NOTE — ED Notes (Signed)
Pt reports history of bulges in back; reports recent N/V he thinks was caused by food poisoning; denies recent fevers; mentioned the back brace he has been wearing since June. Pt has brace next to him in a chair. States pain currently a 4/10; resp reg/unlabored; skin dry; laying calmly on stretcher.

## 2021-02-04 ENCOUNTER — Telehealth: Payer: Self-pay | Admitting: Emergency Medicine

## 2021-02-04 MED ORDER — OXYCODONE-ACETAMINOPHEN 5-325 MG PO TABS: 1.0000 | ORAL_TABLET | Freq: Four times a day (QID) | ORAL | 0 refills | Status: AC | PRN

## 2021-02-04 NOTE — Telephone Encounter (Cosign Needed)
Patient returned to the emergency department today having not received his prescription for Percocet.  Patient had 4 medications prescribed, he was able to fill 3 of them.  Reviewing the prescription long, 3 out of the 4 show received by the pharmacy, Percocet was visualized as sent to the pharmacy but did not visualize as being received.  I will represcribe the patient's Percocet prescription at this time.

## 2021-02-08 ENCOUNTER — Ambulatory Visit: Payer: BLUE CROSS/BLUE SHIELD | Attending: Internal Medicine | Admitting: Physical Therapy

## 2021-02-08 ENCOUNTER — Encounter: Payer: Self-pay | Admitting: Physical Therapy

## 2021-02-08 DIAGNOSIS — M545 Low back pain, unspecified: Secondary | ICD-10-CM | POA: Insufficient documentation

## 2021-02-08 DIAGNOSIS — G8929 Other chronic pain: Secondary | ICD-10-CM | POA: Insufficient documentation

## 2021-02-08 NOTE — Therapy (Addendum)
[facility]INE [address] [place], [address] Phone: [phone]   Fax:  [phone]  Physical Therapy Evaluation  Patient Details  Name: Martin Boyer MRN: [id number] Date of Birth: [birth year] No data recorded  Encounter Date: 02/08/2021     Past Medical History:  Diagnosis Date   Asthma     History reviewed. No pertinent surgical history.  There were no vitals filed for this visit.      Cervical AROM   WNL *patient demonstrated all motions with full active range with no pain with isometric testing  Shoulder AROM   WNL bilaterally   Shoulder Strength   Shoulder strength 5/5 bilaterally in all planes  Lumbar spine  WNL *Patient lumbar AROM was within normal limits in all motion with reports of increased pain with overpressure also in directions to approx T12/L1, though patient reports "pain is at L5 where the rupture is" previously reporting rupture was at "T5"  PAIVM: Normal joint mobility and integrity diffusely across spine  Normal End Feel from T1-sacrum   Hip AROM/PROM  WNL  Hip MMT  Hip 5/5 strength bilaterally all planes  Gait:  nonantalgic; symmterical step/stride length  Palpation: inconsistent with concordant pain; patient hypersensitive to light touch between L3-L4 vertebra and L paraspinal group  Patient reports increase in pain only to approx L3 CPA with grade 2 mobilization that did not improve with repeated movement. Initially stated primary pain at level L5 (pointing to approx T12)  Posture: Nearly normal; with slight forward head posture  Sensation: intact BLE; appears intact BUE  Special Tests  Standard Plank: >60sec without pain Reports previously was completing an hour and a half  Neuromusucular Re-Ed Attempted to educate patient on hypersensitivity and pain cycle, and pain without mechanical origin, reviewing imaging and examination findings. Patient  interrupting throughout education, unable to accept education provided. PT suggested incremental brace weaning with education on time frames as well- to which patient reports that he should "wear his brace because a neuro surgeon told him to" (previously stated that he obtained brace on his own). PT educated patient on brace as deferential to core strengthening, especially in lieu of any present spinal instability   Objective measurements completed on examination: See above findings.               PT Short Term Goals - 02/08/21 1515       PT SHORT TERM GOAL #1   Title Pt will be independent with HEP in order to self-manage back pain.    Baseline HEP given    Time 4    Period Weeks    Status New    Target Date 03/08/21               PT Long Term Goals - 02/08/21 1516       PT LONG TERM GOAL #1   Title Patient will increase FOTO score to 80 to demonstrate predicted increase in functional mobility to complete ADLs    Baseline 02/08/21: 79    Time 8    Period Weeks    Status New    Target Date 04/05/21      PT LONG TERM GOAL #2   Title Pt will decrease worst back pain as reported on NPRS by at least 2 points in order to demonstrate clinically significant reduction in back pain.    Baseline 02/08/21: 7/10 NPRS    Time 8    Period Weeks  Status New    Target Date 04/05/21                      Patient will benefit from skilled therapeutic intervention in order to improve the following deficits and impairments:  Pain, Decreased activity tolerance  Visit Diagnosis: Chronic midline low back pain, unspecified whether sciatica present     Problem List There are no problems to display for this patient.   [name] DPT Romilda Joy, SPT  [name], PT 02/21/2021, 10:09 AM  [facility] PHYSICAL AND SPORTS MEDICINE [address] [place], [address] Phone: [phone]   Fax:   [phone]  Name: Martin Boyer MRN: [id number] Date of Birth: [birth year]

## 2021-02-12 ENCOUNTER — Ambulatory Visit: Payer: BLUE CROSS/BLUE SHIELD | Admitting: Physical Therapy

## 2021-02-12 VITALS — BP 129/76 | HR 88

## 2021-02-12 DIAGNOSIS — G8929 Other chronic pain: Secondary | ICD-10-CM

## 2021-02-12 DIAGNOSIS — M545 Low back pain, unspecified: Secondary | ICD-10-CM | POA: Diagnosis not present

## 2021-02-12 NOTE — Therapy (Signed)
[place] [facility] [address], [place], [address] Phone: [phone]   Fax:  [phone]  Physical Therapy Treatment  Patient Details  Name: Martin Boyer MRN: [id number] Date of Birth: 02-16-2001 No data recorded  Encounter Date: 02/12/2021   PT End of Session - 02/12/21 1524     Visit Number 2    Number of Visits 17    Date for PT Re-Evaluation 04/05/21    PT Start Time 1516    PT Stop Time 1558    PT Time Calculation (min) 42 min    Behavior During Therapy [facility] for tasks assessed/performed             Past Medical History:  Diagnosis Date   Asthma     History reviewed. No pertinent surgical history.  Vitals:   02/12/21 1555  BP: 129/76  Pulse: 88  SpO2: 97%     Subjective Assessment - 02/12/21 1519     Subjective Pt reports not wearing back brace since last visit. He says he was only wearing it because was told to, and found it very uncomfortable and is happy to be out of it. PT continues to remind patient that previous PA Manning Charity also educated him against brace wearing. He reports low back pain is a 4/10 pain on NPRS.    Pertinent History MRi    Limitations Sitting;Lifting;Standing;Walking    How long can you sit comfortably?    How long can you stand comfortably? 90 min    How long can you walk comfortably? 20-60min    Diagnostic tests MRI bulging    Patient Stated Goals weighlifitng    Currently in Pain? Yes             Therex   Recumbent bike L3 x 5 min  Suitcase squat 10# 2 x 12 reps with good form and no reports of increased pain; patient reported clicking in the neck that is concerning   Deadlift from floor 35# 2 x 10 reps with good form; verbal cues to slow movement with contraction   Palloff press; anti-rotation 3 x 25 sec   *Patient also educated on using/bringing asthma inhaler to therapy to enhance comfortability and improve performance during  session.   Neuromusucular Re-Ed  PT continued educating patient on hypersensitivity, pain cycle, and pain without mechanical origin, review of imaging and examination findings. Pt educated on lifting and continuing normal activities within tolerance to increase normalization of movement and decrease perception/awareness of pain.                           PT Education - 02/13/21 [phone]     Education Details PT continued education patient on hypersensitivity, pain cycle, and pain without mechanical origin, review of imaging and examination findings. Patient also educated on using/bringing asthma inhaler to therapy to enhance comfortability and improve performance during session.    Person(s) Educated Patient    Methods Explanation    Comprehension Verbalized understanding              PT Short Term Goals - 02/08/21 1515       PT SHORT TERM GOAL #1   Title Pt will be independent with HEP in order to self-manage back pain.    Baseline HEP given    Time 4    Period Weeks    Status New    Target Date 03/08/21  PT Long Term Goals - 02/08/21 1516       PT LONG TERM GOAL #1   Title Patient will increase FOTO score to 80 to demonstrate predicted increase in functional mobility to complete ADLs    Baseline 02/08/21: 79    Time 8    Period Weeks    Status New    Target Date 04/05/21      PT LONG TERM GOAL #2   Title Pt will decrease worst back pain as reported on NPRS by at least 2 points in order to demonstrate clinically significant reduction in back pain.    Baseline 02/08/21: 7/10 NPRS    Time 8    Period Weeks    Status New    Target Date 04/05/21                   Plan - 02/13/21 1016     Clinical Impression Statement PT initiated therapeutic exercise emphasizing lifting technique, increase confidence in normalization with movement, and pain science education. Pt demostrated good form with lifting technique with no  signs of increased pain, minimal to no cuing needed for proper technique. Pt with questions on the level of activity he can complete, and PT continued to educate patient that he should gradually increase his activity but that there is no reason mechanically that he needs to "avoid" regular activities. Patient retorts that he should "go lift 300lbs" to which PT continues to educate him on graded exposure, which he eventually verbalizes understanding of. Pt expresses concern over "reinjury" if he was to complete exercises completed at PT at home. PT still does not quite understand the initial injury in [place], pt reporting a full disc rupture, but there is lack of evidence in current imaging to support this extent of injury. PT however, simply encouraged patient that exercises completed today are safe to complete at home and that he is not at risk of injury from them. Session somewhat limited due to rest breaks following each set d/t patient reporting SOB. Patient with no physically concerning symptoms (increased breath rate, color, accessory muscle use) and stable vitals throughout. Pt attributes SOB to being deconditioned, but  considering exercises completed, patient age and condition, subjective report of symptoms is not correlating to medical rationale. PT advised patient bring inhaler for exercise induced asthma symptoms, in lieu of any seen rationale for SOB symptoms, which patient is very resistant to. PT continues to advise pysch consult as well, as symptoms seem to be pyschologically induced in the absence of supporting medical evidence. PT will continue graded exercise exposure and pain science education as able, and will reach out to PCP for support of a holistic plan of care involving mental health professionals as well.    Personal Factors and Comorbidities Time since onset of injury/illness/exacerbation    Examination-Activity Limitations Lift;Bend;Stand;Sit;Locomotion Level     Examination-Participation Restrictions School;Community Activity;Occupation    Stability/Clinical Decision Making Unstable/Unpredictable    Clinical Decision Making High    Rehab Potential Good    PT Frequency 2x / week    PT Duration 8 weeks    PT Treatment/Interventions Traction;Therapeutic exercise;Therapeutic activities;Manual techniques;Patient/family education;Neuromuscular re-education;Passive range of motion;Dry needling;Gait training;Stair training;Functional mobility training;ADLs/Self Care Home Management;Cryotherapy;Electrical Stimulation;Moist Heat    PT Next Visit Plan review HEP; constistency of symptoms    PT Home Exercise Plan prone extension    Consulted and Agree with Plan of Care Patient  Patient will benefit from skilled therapeutic intervention in order to improve the following deficits and impairments:  Pain, Decreased activity tolerance  Visit Diagnosis: Chronic midline low back pain, unspecified whether sciatica present     Problem List There are no problems to display for this patient.    [name] DPT Romilda Joy, SPT  [name], PT 02/13/2021, 1:56 PM  [facility] PHYSICAL AND SPORTS MEDICINE [address] [place], [address] Phone: [phone]   Fax:  [phone]  Name: Clemence Stillings MRN: [id number] Date of Birth: 02/27/2001

## 2021-02-13 ENCOUNTER — Encounter: Payer: Self-pay | Admitting: Physical Therapy

## 2021-02-14 ENCOUNTER — Ambulatory Visit: Payer: BLUE CROSS/BLUE SHIELD | Admitting: Physical Therapy

## 2021-02-14 ENCOUNTER — Encounter: Payer: Self-pay | Admitting: Physical Therapy

## 2021-02-14 DIAGNOSIS — M545 Low back pain, unspecified: Secondary | ICD-10-CM | POA: Diagnosis not present

## 2021-02-14 DIAGNOSIS — G8929 Other chronic pain: Secondary | ICD-10-CM

## 2021-02-14 NOTE — Therapy (Signed)
[facility] [address], [place], [address] Phone: [phone]   Fax:  [phone]  Physical Therapy Treatment  Patient Details  Name: Leng Montesdeoca MRN: [id number] Date of Birth: 01/29/2001 No data recorded  Encounter Date: 02/14/2021   PT End of Session - 02/14/21 1523     Visit Number 3    Number of Visits 17    Date for PT Re-Evaluation 04/05/21    PT Start Time 1518    PT Stop Time 1600    PT Time Calculation (min) 42 min    Activity Tolerance Patient tolerated treatment well    Behavior During Therapy [facility] for tasks assessed/performed             Past Medical History:  Diagnosis Date   Asthma     History reviewed. No pertinent surgical history.  There were no vitals filed for this visit.   Subjective Assessment - 02/14/21 1520     Subjective Patient reports he felt good following last session, was just tired, no increased pain or soreness. He reports he has been painting a lot but has not been doing much more physical activity. Feeling good overall.    Pertinent History MRi    Limitations Sitting;Lifting;Standing;Walking    How long can you sit comfortably?    How long can you stand comfortably? 90 min    How long can you walk comfortably? 20-21min    Diagnostic tests MRI bulging    Patient Stated Goals weighlifitng    Pain Onset More than a month ago               Therex    Recumbent bike L3 x 5 min   Deadlift from floor 45# 2 x 10 reps with good form; verbal cues to slow movement with contraction   Single leg RDL 30# KB 2x 10 bilat with min cuing for eccentric control with good carry over  Squat with 20# barbell back position with 10# wt hanging from one side x10 reps each side min cuing for set up with good carry over; again with 15# hanging from one side 2 x10 reps each side, min cuing for core engagement with good carry over  Marjo Bicker pose FWD/R lateral/L lateral  30sec all                                 PT Education - 02/14/21 1522     Education Details therex form/technique    Person(s) Educated Patient    Methods Explanation;Demonstration;Verbal cues    Comprehension Verbalized understanding;Returned demonstration;Verbal cues required              PT Short Term Goals - 02/08/21 1515       PT SHORT TERM GOAL #1   Title Pt will be independent with HEP in order to self-manage back pain.    Baseline HEP given    Time 4    Period Weeks    Status New    Target Date 03/08/21               PT Long Term Goals - 02/08/21 1516       PT LONG TERM GOAL #1   Title Patient will increase FOTO score to 80 to demonstrate predicted increase in functional mobility to complete ADLs    Baseline 02/08/21: 79    Time 8  Period Weeks    Status New    Target Date 04/05/21      PT LONG TERM GOAL #2   Title Pt will decrease worst back pain as reported on NPRS by at least 2 points in order to demonstrate clinically significant reduction in back pain.    Baseline 02/08/21: 7/10 NPRS    Time 8    Period Weeks    Status New    Target Date 04/05/21                   Plan - 02/14/21 1554     Clinical Impression Statement PT continued therex progression for progressive core strengthening with success. Paitent with no increased pain throughout session, some fatigue from deconditioning. Patient requires very minimal cuing for correction of therex. PT continued to encouarge patinet to return to exercise regimen, starting with light weights (spoecifically 15# per UE for overhead activity, assisted pull ups, light combination movements, etc.) pt verbalizes understanding. PT will continue progression as able.    Personal Factors and Comorbidities Time since onset of injury/illness/exacerbation    Examination-Activity Limitations Lift;Bend;Stand;Sit;Locomotion Level    Examination-Participation Restrictions  School;Community Activity;Occupation    Stability/Clinical Decision Making Unstable/Unpredictable    Clinical Decision Making Moderate    Rehab Potential Good    PT Frequency 2x / week    PT Duration 8 weeks    PT Treatment/Interventions Traction;Therapeutic exercise;Therapeutic activities;Manual techniques;Patient/family education;Neuromuscular re-education;Passive range of motion;Dry needling;Gait training;Stair training;Functional mobility training;ADLs/Self Care Home Management;Cryotherapy;Electrical Stimulation;Moist Heat    PT Next Visit Plan review HEP; constistency of symptoms    PT Home Exercise Plan prone extension    Consulted and Agree with Plan of Care Patient             Patient will benefit from skilled therapeutic intervention in order to improve the following deficits and impairments:  Pain, Decreased activity tolerance  Visit Diagnosis: Chronic midline low back pain, unspecified whether sciatica present     Problem List There are no problems to display for this patient.  [name] DPT [name], PT 02/14/2021, 4:05 PM  [place] [facility] PHYSICAL AND SPORTS MEDICINE [address], [place], [address] Phone: [phone]   Fax:  [phone]  Name: Thatcher Doberstein MRN: [id number] Date of Birth: [birth year]

## 2021-02-20 ENCOUNTER — Ambulatory Visit: Payer: BLUE CROSS/BLUE SHIELD | Admitting: Physical Therapy

## 2021-02-21 ENCOUNTER — Ambulatory Visit: Payer: BLUE CROSS/BLUE SHIELD | Attending: Internal Medicine | Admitting: Physical Therapy

## 2021-02-21 ENCOUNTER — Encounter: Payer: Self-pay | Admitting: Physical Therapy

## 2021-02-21 DIAGNOSIS — M545 Low back pain, unspecified: Secondary | ICD-10-CM | POA: Diagnosis not present

## 2021-02-21 DIAGNOSIS — G8929 Other chronic pain: Secondary | ICD-10-CM | POA: Diagnosis present

## 2021-02-21 NOTE — Therapy (Signed)
[facility]TS MEDICINE [address], [place], [address] Phone: [phone]   Fax:  [phone]  Physical Therapy Treatment  Patient Details  Name: Martin Boyer MRN: [id number] Date of Birth: 2000-09-27 No data recorded  Encounter Date: 02/21/2021   PT End of Session - 02/21/21 1351     Visit Number 4    Number of Visits 17    Date for PT Re-Evaluation 04/05/21    PT Start Time 1346    PT Stop Time 1426    PT Time Calculation (min) 40 min    Activity Tolerance Patient tolerated treatment well    Behavior During Therapy [facility] for tasks assessed/performed             Past Medical History:  Diagnosis Date   Asthma     History reviewed. No pertinent surgical history.  There were no vitals filed for this visit.   Subjective Assessment - 02/21/21 1349     Subjective Pt reports confidence in gradual increase in physical activity. He reports being less sore and is able to build is cardiovascular activity by walking his dogs.    Pertinent History MRi    How long can you sit comfortably?    How long can you stand comfortably? 90 min    How long can you walk comfortably? 20-65min    Diagnostic tests MRI bulging    Patient Stated Goals weighlifitng            Therex  Recumbent bike L3 x 5 min  [place] 30# KB each UE x 100 ft each  [place] [employer] 20# KB single UE with minimal cueing to correct form   Deadlift from floor 55# 2 x 10 reps with good form; verbal cues to slow movement with contraction   Lateral step downs from 12" step #20 KB 1 x 10 reps demonstrating good femoral control  Goblet Squat 2 x 10 rep with cues for increases eccentric control and slowing of contraction   Hand plank with alternating knee tap 1 x 10 reps with good form thoughout                             PT Education - 02/22/21 0918     Education Details Pt educated on not wearing  weight lifting belt when lifting for abdominal support.    Person(s) Educated Patient    Methods Explanation    Comprehension Verbalized understanding              PT Short Term Goals - 02/08/21 1515       PT SHORT TERM GOAL #1   Title Pt will be independent with HEP in order to self-manage back pain.    Baseline HEP given    Time 4    Period Weeks    Status New    Target Date 03/08/21               PT Long Term Goals - 02/08/21 1516       PT LONG TERM GOAL #1   Title Patient will increase FOTO score to 80 to demonstrate predicted increase in functional mobility to complete ADLs    Baseline 02/08/21: 79    Time 8    Period Weeks    Status New    Target Date 04/05/21      PT LONG TERM GOAL #2   Title  Pt will decrease worst back pain as reported on NPRS by at least 2 points in order to demonstrate clinically significant reduction in back pain.    Baseline 02/08/21: 7/10 NPRS    Time 8    Period Weeks    Status New    Target Date 04/05/21                   Plan - 02/22/21 0909     Clinical Impression Statement PT continued therex progression for progressive core strengthening with success. Paitent tolerated session well with no increased pain throughout session. Patient requires very minimal cuing for correction of therex diplaying good form. PT continued to encouarge patinet to return to exercise regimen increasing weight incrementally. Pt verbalizes understanding and agreed to moving therapy visits to 1x/week for the next three session.    Personal Factors and Comorbidities Time since onset of injury/illness/exacerbation    Examination-Activity Limitations Lift;Bend;Stand;Sit;Locomotion Level    Examination-Participation Restrictions School;Community Activity;Occupation    Stability/Clinical Decision Making Unstable/Unpredictable    Clinical Decision Making Moderate    Rehab Potential Good    PT Frequency 2x / week    PT Duration 8 weeks    PT  Treatment/Interventions Traction;Therapeutic exercise;Therapeutic activities;Manual techniques;Patient/family education;Neuromuscular re-education;Passive range of motion;Dry needling;Gait training;Stair training;Functional mobility training;ADLs/Self Care Home Management;Cryotherapy;Electrical Stimulation;Moist Heat    PT Next Visit Plan constistency of symptoms    PT Home Exercise Plan prone extension    Consulted and Agree with Plan of Care Patient             Patient will benefit from skilled therapeutic intervention in order to improve the following deficits and impairments:  Pain, Decreased activity tolerance  Visit Diagnosis: Chronic midline low back pain, unspecified whether sciatica present     Problem List There are no problems to display for this patient.   [name] DPT Romilda Joy, SPT  [name], PT 02/22/2021, 1:23 PM  [place] [facility] PHYSICAL AND SPORTS MEDICINE [address] [place], [address] Phone: [phone]   Fax:  [phone]  Name: Martin Boyer MRN: [id number] Date of Birth: 12/11/2000

## 2021-02-21 NOTE — Addendum Note (Signed)
Addended by: Lawrence Marseilles on: 02/21/2021 10:12 AM   Modules accepted: Orders

## 2021-02-23 ENCOUNTER — Ambulatory Visit: Payer: BLUE CROSS/BLUE SHIELD | Admitting: Physical Therapy

## 2021-02-27 ENCOUNTER — Ambulatory Visit: Payer: BLUE CROSS/BLUE SHIELD | Admitting: Physical Therapy

## 2021-03-05 ENCOUNTER — Ambulatory Visit: Payer: BLUE CROSS/BLUE SHIELD | Admitting: Physical Therapy

## 2021-03-08 ENCOUNTER — Encounter: Payer: BLUE CROSS/BLUE SHIELD | Admitting: Physical Therapy

## 2021-03-12 ENCOUNTER — Encounter: Payer: BLUE CROSS/BLUE SHIELD | Admitting: Physical Therapy

## 2021-03-16 DIAGNOSIS — F429 Obsessive-compulsive disorder, unspecified: Secondary | ICD-10-CM | POA: Diagnosis present

## 2021-03-16 DIAGNOSIS — F17290 Nicotine dependence, other tobacco product, uncomplicated: Secondary | ICD-10-CM | POA: Insufficient documentation

## 2021-03-16 DIAGNOSIS — Z20822 Contact with and (suspected) exposure to covid-19: Secondary | ICD-10-CM | POA: Insufficient documentation

## 2021-03-16 DIAGNOSIS — J45909 Unspecified asthma, uncomplicated: Secondary | ICD-10-CM | POA: Diagnosis not present

## 2021-03-16 DIAGNOSIS — Z046 Encounter for general psychiatric examination, requested by authority: Secondary | ICD-10-CM | POA: Diagnosis not present

## 2021-03-16 DIAGNOSIS — R45851 Suicidal ideations: Secondary | ICD-10-CM | POA: Diagnosis not present

## 2021-03-16 DIAGNOSIS — D72829 Elevated white blood cell count, unspecified: Secondary | ICD-10-CM | POA: Insufficient documentation

## 2021-03-16 NOTE — ED Triage Notes (Signed)
Patient reports depression and that he "wages civil protests". Patient states that he wants to prove that he is a "victim of the system". Patient reports that he took mushrooms tonight and then called the police and reported that he was driving under the influence.

## 2021-03-17 ENCOUNTER — Other Ambulatory Visit: Payer: Self-pay

## 2021-03-17 ENCOUNTER — Emergency Department
Admission: EM | Admit: 2021-03-17 | Discharge: 2021-03-17 | Disposition: A | Discharge status: Home / Self Care | Payer: BLUE CROSS/BLUE SHIELD | Attending: Emergency Medicine | Admitting: Emergency Medicine

## 2021-03-17 DIAGNOSIS — R45851 Suicidal ideations: Secondary | ICD-10-CM

## 2021-03-17 DIAGNOSIS — T40995A Adverse effect of other psychodysleptics [hallucinogens], initial encounter: Secondary | ICD-10-CM

## 2021-03-17 LAB — COMPREHENSIVE METABOLIC PANEL
ALT: 22 U/L (ref 0–44)
AST: 32 U/L (ref 15–41)
Albumin: 5 g/dL (ref 3.5–5.0)
Alkaline Phosphatase: 48 U/L (ref 38–126)
Anion gap: 9 (ref 5–15)
BUN: 16 mg/dL (ref 6–20)
CO2: 25 mmol/L (ref 22–32)
Calcium: 9.9 mg/dL (ref 8.9–10.3)
Chloride: 103 mmol/L (ref 98–111)
Creatinine, Ser: 1.14 mg/dL (ref 0.61–1.24)
GFR, Estimated: >60 mL/min (ref >60)
Glucose, Bld: 126 mg/dL — ABNORMAL HIGH (ref 70–99)
Potassium: 3.9 mmol/L (ref 3.5–5.1)
Sodium: 137 mmol/L (ref 135–145)
Total Bilirubin: 0.8 mg/dL (ref 0.3–1.2)
Total Protein: 7.9 g/dL (ref 6.5–8.1)

## 2021-03-17 LAB — CBC
HCT: 44.4 % (ref 39.0–52.0)
Hemoglobin: 15.6 g/dL (ref 13.0–17.0)
MCH: 31.3 pg (ref 26.0–34.0)
MCHC: 35.1 g/dL (ref 30.0–36.0)
MCV: 89.2 fL (ref 80.0–100.0)
Platelets: 302 10*3/uL (ref 150–400)
RBC: 4.98 MIL/uL (ref 4.22–5.81)
RDW: 11.7 % (ref 11.5–15.5)
WBC: 13.2 10*3/uL — ABNORMAL HIGH (ref 4.0–10.5)
nRBC: 0 % (ref 0.0–0.2)

## 2021-03-17 LAB — SALICYLATE LEVEL: Salicylate Lvl: <7 mg/dL — ABNORMAL LOW (ref 7.0–30.0)

## 2021-03-17 LAB — ETHANOL: Alcohol, Ethyl (B): <10 mg/dL (ref <10)

## 2021-03-17 LAB — RESP PANEL BY RT-PCR (FLU A&B, COVID) ARPGX2
Influenza A by PCR: NEGATIVE
Influenza B by PCR: NEGATIVE
SARS Coronavirus 2 by RT PCR: NEGATIVE

## 2021-03-17 LAB — URINE DRUG SCREEN, QUALITATIVE (ARMC ONLY)
Amphetamines, Ur Screen: NOT DETECTED
Barbiturates, Ur Screen: NOT DETECTED
Benzodiazepine, Ur Scrn: NOT DETECTED
Cannabinoid 50 Ng, Ur ~~LOC~~: POSITIVE — AB
Cocaine Metabolite,Ur ~~LOC~~: NOT DETECTED
MDMA (Ecstasy)Ur Screen: NOT DETECTED
Methadone Scn, Ur: NOT DETECTED
Opiate, Ur Screen: NOT DETECTED
Phencyclidine (PCP) Ur S: NOT DETECTED
Tricyclic, Ur Screen: NOT DETECTED

## 2021-03-17 LAB — ACETAMINOPHEN LEVEL: Acetaminophen (Tylenol), Serum: <10 ug/mL — ABNORMAL LOW (ref 10–30)

## 2021-03-17 NOTE — ED Provider Notes (Signed)
  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  He has been waiting 9.5 hours for psychiatric evaluation.  I see him in triage and he has clinically sobered up.  He has linear thought processes and insight into what happened last night.  He reports multiple deaths to family and friends that he felt were unfair and due to suicidal issues.  He reports he was attempting to bring awareness to these issues due to guilt that he felt in the setting of his friend dying.  He has no suicidality and reports feeling better now.  He wants to go home to take care of his 4 dogs.  We discussed healthy coping mechanisms, dealing with mental health and suicidal issues, return precautions for the ED and patient is suitable for outpatient management.   Delton Prairie, MD 03/17/21 [phone]

## 2021-03-17 NOTE — ED Provider Notes (Signed)
[facility] Provider Note  ____________________________________________   Event Date/Time   First MD Initiated Contact with Patient 03/17/21 0105     (approximate)  I have reviewed the triage vital signs and the nursing notes.   HISTORY  Chief Complaint Psychiatric Evaluation    HPI Martin Boyer is a 20 y.o. male with history of asthma and what he states is undiagnosed depression who presents to the emergency department with "morbid thoughts".  He states that he has been thinking about what to do with his four dogs when he is gone.  He states that he was also thinking about using his death as a way to raise awareness for mental illness.  States that he realized he needed help and that is why he came to the emergency department.  When asked if he is having suicidal or homicidal thoughts he does not answer directly about suicidal thoughts but states he is not thinking of wanting to harm anyone else.  Denies hallucinations.  States he has been taking shrooms and smoking marijuana tonight.  No alcohol use.  Denies any medical complaints.        Past Medical History:  Diagnosis Date   Asthma     There are no problems to display for this patient.   History reviewed. No pertinent surgical history.  Prior to Admission medications   Medication Sig Start Date End Date Taking? Authorizing Provider  meloxicam (MOBIC) 15 MG tablet Take 1 tablet (15 mg total) by mouth daily. 02/03/21   Cuthriell, Delorise Royals, PA-C  methocarbamol (ROBAXIN) 500 MG tablet Take 1 tablet (500 mg total) by mouth 4 (four) times daily. 02/03/21   Cuthriell, Delorise Royals, PA-C  ondansetron (ZOFRAN-ODT) 4 MG disintegrating tablet Take 1 tablet (4 mg total) by mouth every 8 (eight) hours as needed for nausea or vomiting. 02/03/21   Cuthriell, Delorise Royals, PA-C  oxyCODONE-acetaminophen (PERCOCET/ROXICET) 5-325 MG tablet Take 1 tablet by mouth every 6 (six) hours as needed for  severe pain. 02/03/21   Cuthriell, Delorise Royals, PA-C  oxyCODONE-acetaminophen (PERCOCET/ROXICET) 5-325 MG tablet Take 1 tablet by mouth every 6 (six) hours as needed for severe pain. 02/04/21   Cuthriell, Delorise Royals, PA-C  predniSONE (STERAPRED UNI-PAK 21 TAB) 10 MG (21) TBPK tablet Take by mouth daily. As directed 08/14/20   Mickie Bail, NP    Allergies Patient has no known allergies.  History reviewed. No pertinent family history.  Social History Social History   Tobacco Use   Smoking status: Some Days    Types: Cigars   Smokeless tobacco: Never  Vaping Use   Vaping Use: Never used  Substance Use Topics   Alcohol use: Not Currently   Drug use: Yes    Types: Marijuana    Review of Systems Constitutional: No fever. Eyes: No visual changes. ENT: No sore throat. Cardiovascular: Denies chest pain. Respiratory: Denies shortness of breath. Gastrointestinal: No nausea, vomiting, diarrhea. Genitourinary: Negative for dysuria. Musculoskeletal: Negative for back pain. Skin: Negative for rash. Neurological: Negative for focal weakness or numbness.  ____________________________________________   PHYSICAL EXAM:  VITAL SIGNS: ED Triage Vitals  Enc Vitals Group     BP 03/17/21 0000 (!) 152/80     Pulse Rate 03/17/21 0000 (!) 117     Resp 03/17/21 0000 16     Temp 03/17/21 0000 99.7 F (37.6 C)     Temp Source 03/17/21 0000 Oral     SpO2 03/17/21 0000 96 %  Weight 03/17/21 0002 130 lb (59 kg)     Height 03/17/21 0002 5\' 6"  (1.676 m)     Head Circumference --      Peak Flow --      Pain Score 03/17/21 0001 3     Pain Loc --      Pain Edu? --      Excl. in GC? --    CONSTITUTIONAL: Alert and oriented and responds appropriately to questions. Well-appearing; well-nourished HEAD: Normocephalic EYES: Conjunctivae clear, pupils appear equal, EOM appear intact ENT: normal nose; moist mucous membranes NECK: Supple, normal ROM CARD: Regular and slightly tachycardic; S1 and  S2 appreciated; no murmurs, no clicks, no rubs, no gallops RESP: Normal chest excursion without splinting or tachypnea; breath sounds clear and equal bilaterally; no wheezes, no rhonchi, no rales, no hypoxia or respiratory distress, speaking full sentences ABD/GI: Normal bowel sounds; non-distended; soft, non-tender, no rebound, no guarding, no peritoneal signs, no hepatosplenomegaly BACK: The back appears normal EXT: Normal ROM in all joints; no deformity noted, no edema; no cyanosis SKIN: Normal color for age and race; warm; no rash on exposed skin NEURO: Moves all extremities equally PSYCH: Odd affect.  Denies HI but will not answer directly about SI.  Does not appear to be responding to internal stimuli.  ____________________________________________   LABS (all labs ordered are listed, but only abnormal results are displayed)  Labs Reviewed  COMPREHENSIVE METABOLIC PANEL - Abnormal; Notable for the following components:      Result Value   Glucose, Bld 126 (*)    All other components within normal limits  SALICYLATE LEVEL - Abnormal; Notable for the following components:   Salicylate Lvl <7.0 (*)    All other components within normal limits  ACETAMINOPHEN LEVEL - Abnormal; Notable for the following components:   Acetaminophen (Tylenol), Serum <10 (*)    All other components within normal limits  CBC - Abnormal; Notable for the following components:   WBC 13.2 (*)    All other components within normal limits  URINE DRUG SCREEN, QUALITATIVE ([facility] ONLY) - Abnormal; Notable for the following components:   Cannabinoid 50 Ng, Ur [place] POSITIVE (*)    All other components within normal limits  RESP PANEL BY RT-PCR (FLU A&B, COVID) ARPGX2  ETHANOL   ____________________________________________  EKG   ____________________________________________  RADIOLOGY I, Shalia Bartko, personally viewed and evaluated these images (plain radiographs) as part of my medical decision making, as well  as reviewing the written report by the radiologist.  ED MD interpretation:    Official radiology report(s): No results found.  ____________________________________________   PROCEDURES  Procedure(s) performed (including Critical Care):  Procedures    ____________________________________________   INITIAL IMPRESSION / ASSESSMENT AND PLAN / ED COURSE  As part of my medical decision making, I reviewed the following data within the electronic MEDICAL RECORD NUMBER Nursing notes reviewed and incorporated, Labs reviewed , Old chart reviewed, A consult was requested and obtained from this/these consultant(s) Psychiatry, and Notes from prior ED visits         Patient here with concerns for suicidal thoughts.  He is here voluntarily.  Does appear slightly intoxicated currently.  We will obtain screening labs and urine and consult psychiatry for further disposition.  ED PROGRESS  Screening labs unremarkable other than a mild leukocytosis.  He denies any infectious symptoms.  Drug screen positive for cannabinoids.  Medically cleared at this time and awaiting psychiatric evaluation.   I reviewed all nursing notes and  pertinent previous records as available.  I have reviewed and interpreted any EKGs, lab and urine results, imaging (as available).  ____________________________________________   FINAL CLINICAL IMPRESSION(S) / ED DIAGNOSES  Final diagnoses:  Suicidal ideation     ED Discharge Orders     None       *Please note:  Roosvelt Churchwell was evaluated in Emergency Department on 03/17/2021 for the symptoms described in the history of present illness. He was evaluated in the context of the global COVID-19 pandemic, which necessitated consideration that the patient might be at risk for infection with the SARS-CoV-2 virus that causes COVID-19. Institutional protocols and algorithms that pertain to the evaluation of patients at risk for COVID-19 are in a state of rapid change based  on information released by regulatory bodies including the CDC and federal and state organizations. These policies and algorithms were followed during the patient's care in the ED.  Some ED evaluations and interventions may be delayed as a result of limited staffing during and the pandemic.*   Note:  This document was prepared using Dragon voice recognition software and may include unintentional dictation errors.    Yu Cragun, Layla Maw, DO 03/17/21 [phone]

## 2021-03-17 NOTE — ED Notes (Signed)
Pt dressed out into burgundy scrubs with this tech and [place], RN in the rm. Pt belongings consist of red/black slide sandals, red/black checkered socks, black pants, white/black hat, orange t-shirt, a grey hoodie, and a cell phone. Pt belongings placed into one pt belongings bag and labeled with pt name. Pt calm and cooperative while dressing out.

## 2021-03-17 NOTE — ED Notes (Signed)
Patient in triage with paranoia and pressured speech. Pt fixated on [facility], being a navy seal, and "beating the system".

## 2021-03-19 ENCOUNTER — Encounter: Payer: BLUE CROSS/BLUE SHIELD | Admitting: Physical Therapy

## 2021-03-21 ENCOUNTER — Encounter: Payer: BLUE CROSS/BLUE SHIELD | Admitting: Physical Therapy

## 2021-03-26 ENCOUNTER — Encounter: Payer: BLUE CROSS/BLUE SHIELD | Admitting: Physical Therapy

## 2021-03-28 ENCOUNTER — Encounter: Payer: BLUE CROSS/BLUE SHIELD | Admitting: Physical Therapy

## 2021-04-02 ENCOUNTER — Encounter: Payer: BLUE CROSS/BLUE SHIELD | Admitting: Physical Therapy

## 2021-04-04 ENCOUNTER — Encounter: Payer: BLUE CROSS/BLUE SHIELD | Admitting: Physical Therapy

## 2021-06-12 ENCOUNTER — Telehealth: Payer: Self-pay

## 2021-06-12 NOTE — Telephone Encounter (Signed)
Patient mother came in to discuss treatment plan for patient.Gave options for here and multiple other psy in the area. Patient mother stated he is suicidal. Information given to crisis center, [facility], and [facility] [place], Crisis line. ROI and new patient packet give, wants to be seen here. Patient/mother to get referral if crisis center/ other options do not work. Explained that a referral is needed in this OP department

## 2022-03-02 IMAGING — MR MR THORACIC SPINE W/O CM
10 of 12 series · 28 of 48 positions shown · non-contrast
Comparison: X-ray 02/03/2021

CLINICAL DATA: Motor neuron disease Mid-back pain Intervertebral
disc disorder History of herniated disc at T5. Worsening back pain,
now numbness and tingling down both legs; Low back pain, progressive
neurologic deficit L5 bulging disc. Worsening pain, now numbness and
tingling down both legs

EXAM:
MRI THORACIC AND LUMBAR SPINE WITHOUT CONTRAST
TECHNIQUE: Multiplanar and multiecho pulse sequences of the thoracic and lumbar
spine were obtained without intravenous contrast.

[Series 18: T1 · sagittal · 6.0mm · 1.41mm/px · 1 of 9 slices shown (1 of 4)]
[im 1/9]
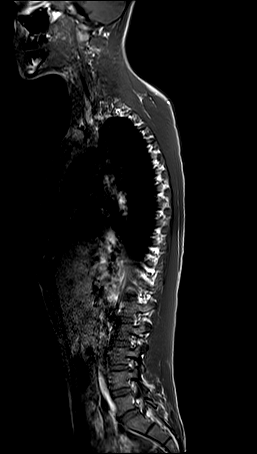

[Series 19: T2 · sagittal · 3.0mm · 1.06mm/px · 2 of 17 slices shown (1 of 4)]
[im 1/17]
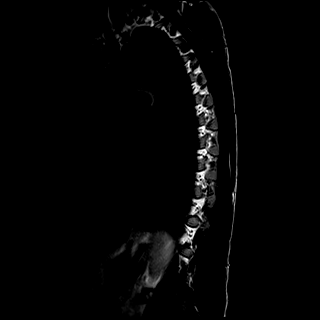
[im 17/17]
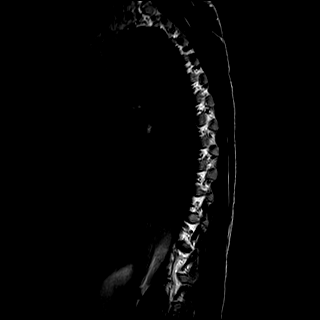

[Series 20: T1 · sagittal · 3.0mm · 1.06mm/px · 2 of 17 slices shown (2 of 4)]
[im 1/17]
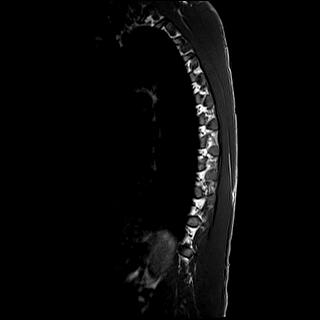
[im 17/17]
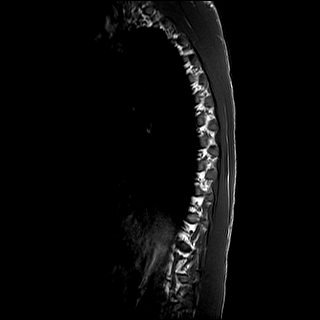

[Series 21: STIR · sagittal · 3.0mm · 0.53mm/px · 2 of 17 slices shown (1 of 2)]
[im 1/17]
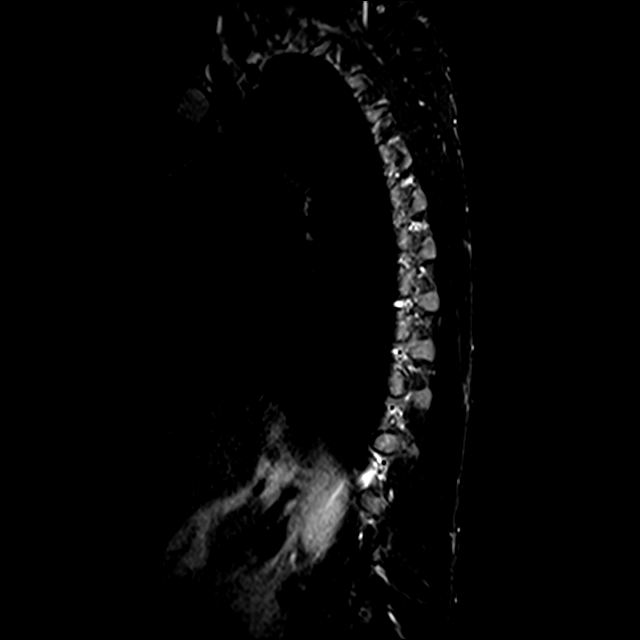
[im 17/17]
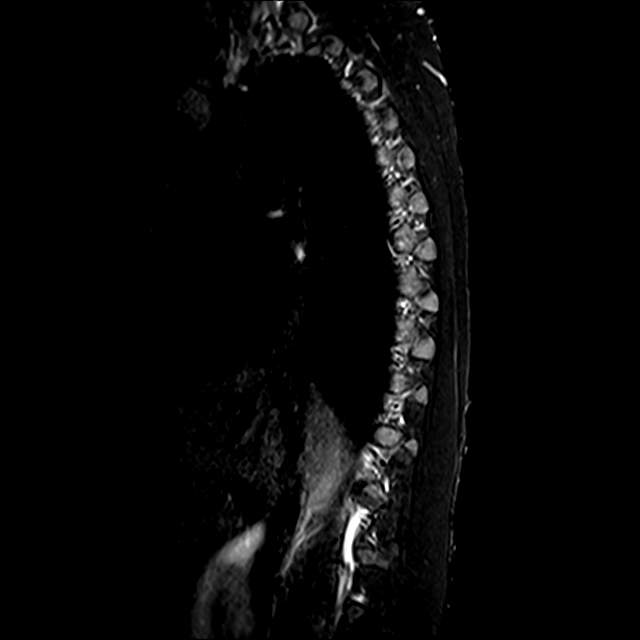

[Series 23: T2 · axial · 4.0mm · 0.59mm/px · z∈[-396,-171]mm · 5 of 39 slices shown (2 of 4)]
[im 1/39]
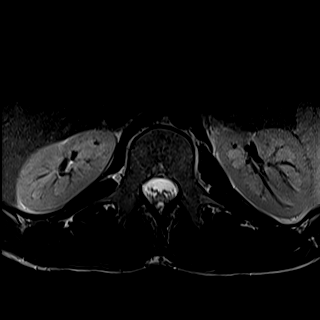
[im 10/39]
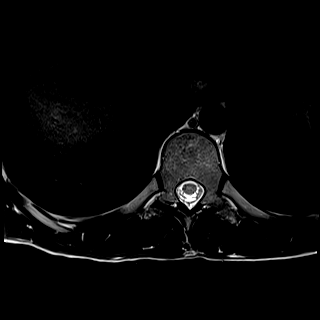
[im 20/39]
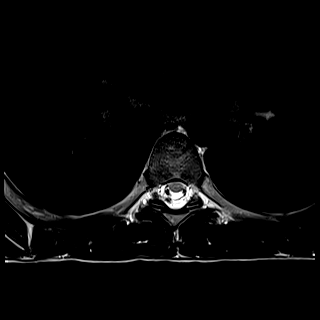
[im 29/39]
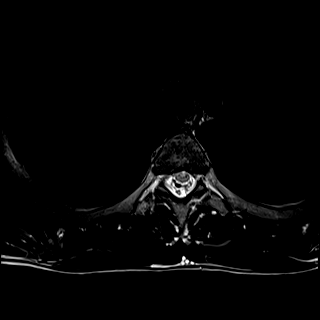
[im 39/39]
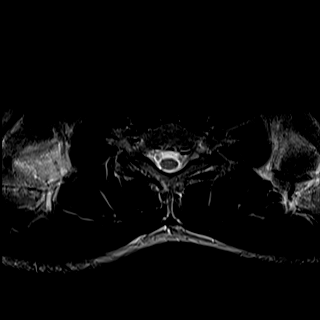

[Series 24: T2 · sagittal · 4.0mm · 0.81mm/px · 2 of 17 slices shown (3 of 4)]
[im 1/17]
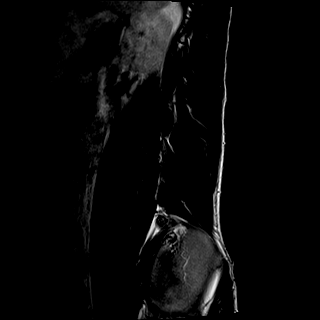
[im 17/17]
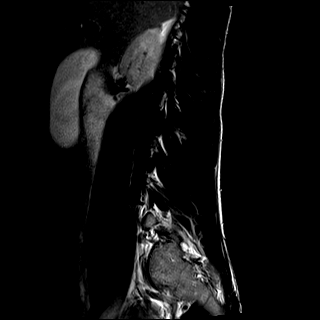

[Series 25: T1 · sagittal · 4.0mm · 0.81mm/px · 2 of 17 slices shown (3 of 4)]
[im 1/17]
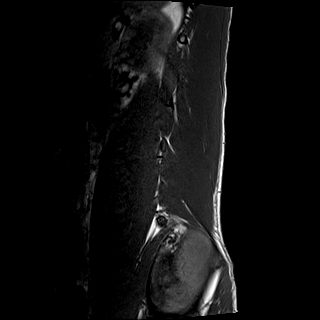
[im 17/17]
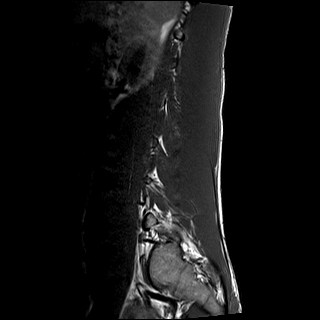

[Series 26: STIR · sagittal · 4.0mm · 0.41mm/px · 2 of 17 slices shown (2 of 2)]
[im 1/17]
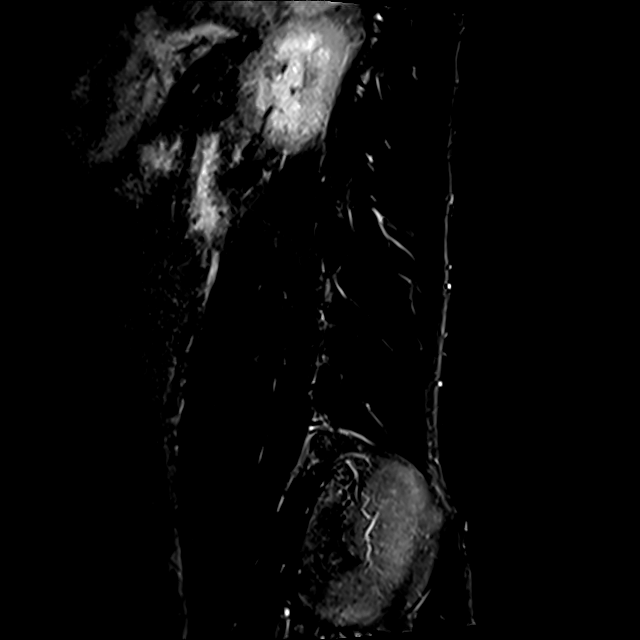
[im 17/17]
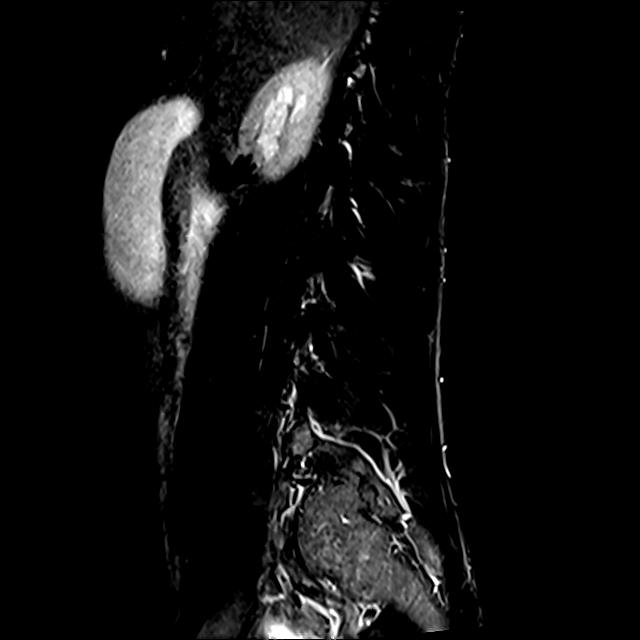

[Series 27: T2 · axial · 4.0mm · 0.78mm/px · z∈[-599,-395]mm · 5 of 36 slices shown (4 of 4)]
[im 1/36]
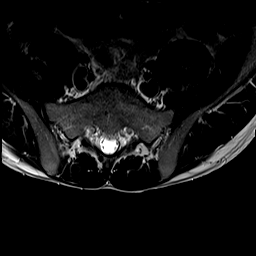
[im 9/36]
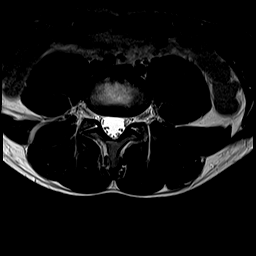
[im 18/36]
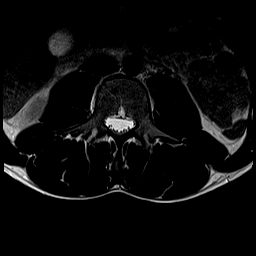
[im 27/36]
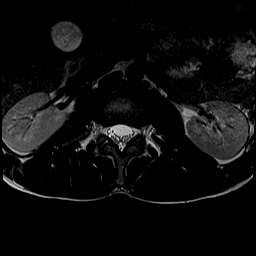
[im 36/36]
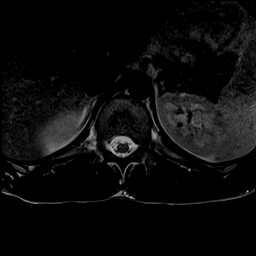

[Series 28: T1 · axial · 4.0mm · 0.39mm/px · z∈[-599,-395]mm · 5 of 36 slices shown (4 of 4)]
[im 1/36]
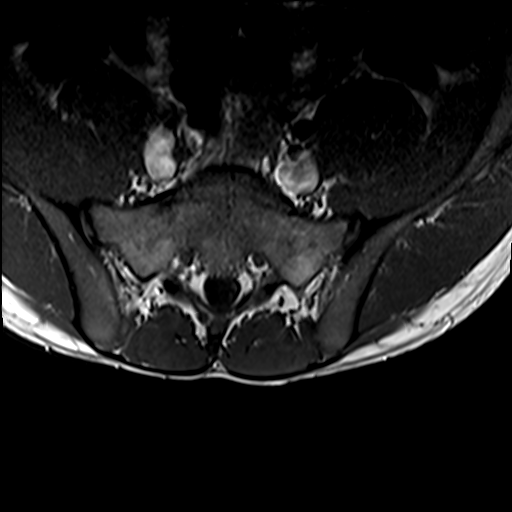
[im 9/36]
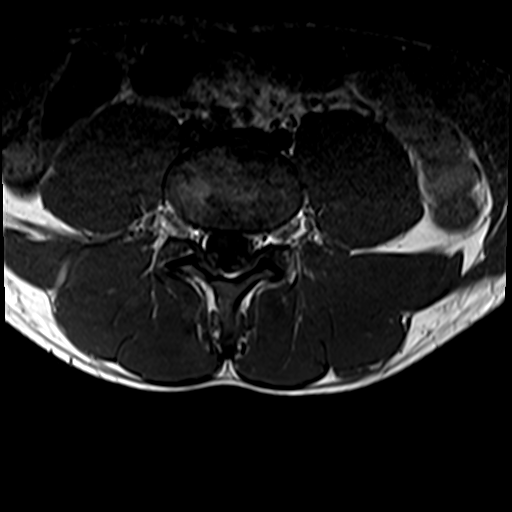
[im 18/36]
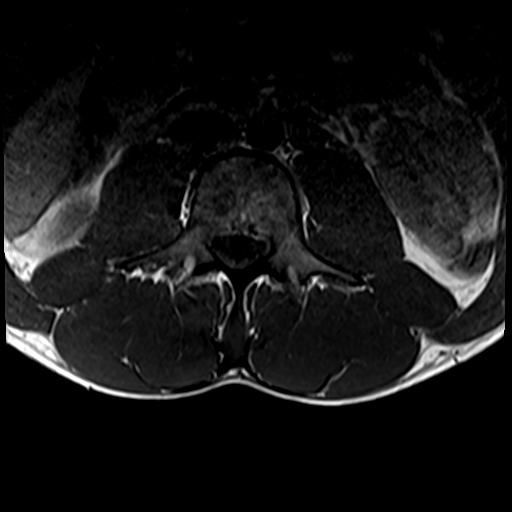
[im 27/36]
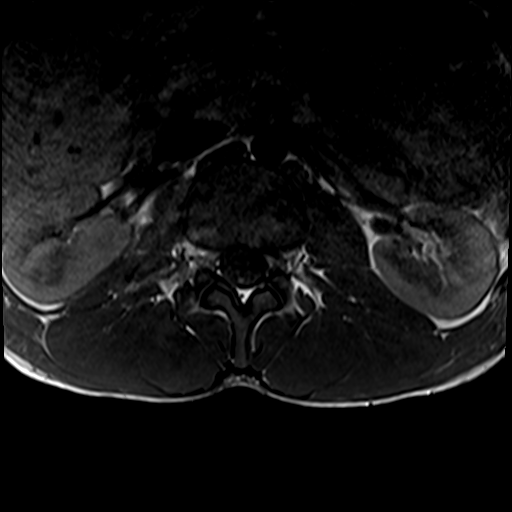
[im 36/36]
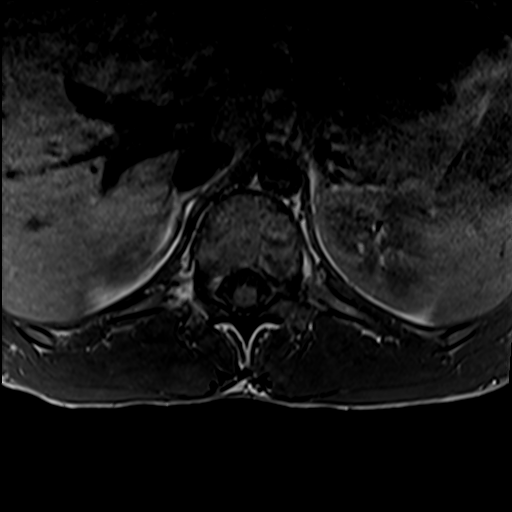

[28 of 48 positions shown; findings below may reference images not displayed]

FINDINGS: MRI THORACIC SPINE FINDINGS

Alignment:  Physiologic.

Vertebrae: No fracture, evidence of discitis, or bone lesion.

Cord:  Normal signal and morphology.

Paraspinal and other soft tissues: Negative.

Disc levels:

Small focal central disc protrusion at T7-8 which abuts the ventral
cord without resultant canal stenosis. Remaining intervertebral
discs of the thoracic spine are otherwise preserved without disc
desiccation or focal disc protrusion. Normal facet joints. No
foraminal or canal stenosis at any level.

MRI LUMBAR SPINE FINDINGS

Segmentation:  Standard.

Alignment:  Physiologic.

Vertebrae:  No fracture, evidence of discitis, or bone lesion.

Conus medullaris and cauda equina: Conus extends to the L1-2 level.
Conus and cauda equina appear normal.

Paraspinal and other soft tissues: Negative.

Disc levels:

T12-L1: Normal.

L1-L2: Normal.

L2-L3: Normal.

L3-L4: Normal.

L4-L5: Normal.

L5-S1: Mild right paracentral disc bulge. Unremarkable facet joints.
There is mild right foraminal stenosis. No canal or subarticular
recess stenosis.
IMPRESSION: MR THORACIC SPINE IMPRESSION:

1. Small focal central disc protrusion at T7-8 which abuts the
ventral cord without resultant canal stenosis.
2. Otherwise unremarkable MRI of the thoracic spine.

MRI LUMBAR SPINE IMPRESSION:

1. Mild right paracentral disc bulge at L5-S1 which results in mild
right foraminal stenosis.
2. Otherwise unremarkable MRI of the lumbar spine.
